# Patient Record
Sex: Female | Born: 1980 | Race: Black or African American | Hispanic: No | Marital: Single | Smoking: Former smoker
Health system: Southern US, Community
[De-identification: ages and names within clinical notes are randomized; demographics above are authoritative.]

## PROBLEM LIST (undated history)

## (undated) DIAGNOSIS — F419 Anxiety disorder, unspecified: Secondary | ICD-10-CM

## (undated) DIAGNOSIS — I10 Essential (primary) hypertension: Secondary | ICD-10-CM

## (undated) HISTORY — PX: ABDOMINAL HYSTERECTOMY: SHX81

---
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
## 2000-01-01 ENCOUNTER — Encounter: Payer: Self-pay | Admitting: Emergency Medicine

## 2001-01-11 ENCOUNTER — Emergency Department (HOSPITAL_COMMUNITY): Admission: EM | Admit: 2001-01-11 | Discharge: 2001-01-11 | Payer: Self-pay | Admitting: Emergency Medicine

## 2001-09-04 ENCOUNTER — Emergency Department (HOSPITAL_COMMUNITY): Admission: EM | Admit: 2001-09-04 | Discharge: 2001-09-04 | Payer: Self-pay | Admitting: Emergency Medicine

## 2001-10-19 ENCOUNTER — Encounter: Payer: Self-pay | Admitting: Emergency Medicine

## 2001-10-19 ENCOUNTER — Emergency Department (HOSPITAL_COMMUNITY): Admission: EM | Admit: 2001-10-19 | Discharge: 2001-10-19 | Payer: Self-pay | Admitting: Emergency Medicine

## 2002-02-11 ENCOUNTER — Emergency Department (HOSPITAL_COMMUNITY): Admission: EM | Admit: 2002-02-11 | Discharge: 2002-02-11 | Payer: Self-pay | Admitting: Emergency Medicine

## 2002-04-19 ENCOUNTER — Inpatient Hospital Stay (HOSPITAL_COMMUNITY): Admission: AD | Admit: 2002-04-19 | Discharge: 2002-04-19 | Payer: Self-pay | Admitting: *Deleted

## 2002-05-01 ENCOUNTER — Inpatient Hospital Stay (HOSPITAL_COMMUNITY): Admission: AD | Admit: 2002-05-01 | Discharge: 2002-05-01 | Payer: Self-pay | Admitting: *Deleted

## 2002-06-24 ENCOUNTER — Emergency Department (HOSPITAL_COMMUNITY): Admission: EM | Admit: 2002-06-24 | Discharge: 2002-06-24 | Payer: Self-pay | Admitting: *Deleted

## 2002-10-15 ENCOUNTER — Emergency Department (HOSPITAL_COMMUNITY): Admission: EM | Admit: 2002-10-15 | Discharge: 2002-10-15 | Payer: Self-pay | Admitting: Emergency Medicine

## 2003-01-28 ENCOUNTER — Inpatient Hospital Stay (HOSPITAL_COMMUNITY): Admission: AD | Admit: 2003-01-28 | Discharge: 2003-01-28 | Payer: Self-pay | Admitting: Obstetrics and Gynecology

## 2003-02-04 ENCOUNTER — Inpatient Hospital Stay (HOSPITAL_COMMUNITY): Admission: AD | Admit: 2003-02-04 | Discharge: 2003-02-04 | Payer: Self-pay | Admitting: *Deleted

## 2004-08-13 ENCOUNTER — Inpatient Hospital Stay (HOSPITAL_COMMUNITY): Admission: AD | Admit: 2004-08-13 | Discharge: 2004-08-13 | Payer: Self-pay | Admitting: *Deleted

## 2005-05-04 ENCOUNTER — Emergency Department (HOSPITAL_COMMUNITY): Admission: EM | Admit: 2005-05-04 | Discharge: 2005-05-04 | Payer: Self-pay | Admitting: Emergency Medicine

## 2005-11-17 ENCOUNTER — Emergency Department (HOSPITAL_COMMUNITY): Admission: EM | Admit: 2005-11-17 | Discharge: 2005-11-17 | Payer: Self-pay | Admitting: Emergency Medicine

## 2006-02-09 ENCOUNTER — Emergency Department (HOSPITAL_COMMUNITY): Admission: EM | Admit: 2006-02-09 | Discharge: 2006-02-09 | Payer: Self-pay | Admitting: Family Medicine

## 2006-11-08 ENCOUNTER — Inpatient Hospital Stay (HOSPITAL_COMMUNITY): Admission: AD | Admit: 2006-11-08 | Discharge: 2006-11-08 | Payer: Self-pay | Admitting: Obstetrics & Gynecology

## 2006-12-09 ENCOUNTER — Emergency Department (HOSPITAL_COMMUNITY): Admission: EM | Admit: 2006-12-09 | Discharge: 2006-12-09 | Payer: Self-pay | Admitting: Emergency Medicine

## 2007-02-08 ENCOUNTER — Emergency Department (HOSPITAL_COMMUNITY): Admission: EM | Admit: 2007-02-08 | Discharge: 2007-02-08 | Payer: Self-pay | Admitting: Emergency Medicine

## 2007-05-15 ENCOUNTER — Emergency Department (HOSPITAL_COMMUNITY): Admission: EM | Admit: 2007-05-15 | Discharge: 2007-05-15 | Payer: Self-pay | Admitting: Emergency Medicine

## 2007-06-19 ENCOUNTER — Emergency Department (HOSPITAL_COMMUNITY): Admission: EM | Admit: 2007-06-19 | Discharge: 2007-06-19 | Payer: Self-pay | Admitting: Emergency Medicine

## 2008-02-28 ENCOUNTER — Emergency Department (HOSPITAL_COMMUNITY): Admission: EM | Admit: 2008-02-28 | Discharge: 2008-02-28 | Payer: Self-pay | Admitting: Emergency Medicine

## 2008-06-20 ENCOUNTER — Inpatient Hospital Stay (HOSPITAL_COMMUNITY): Admission: AD | Admit: 2008-06-20 | Discharge: 2008-06-20 | Payer: Self-pay | Admitting: Gynecology

## 2009-02-19 ENCOUNTER — Inpatient Hospital Stay (HOSPITAL_COMMUNITY): Admission: AD | Admit: 2009-02-19 | Discharge: 2009-02-19 | Payer: Self-pay | Admitting: Family Medicine

## 2009-02-19 ENCOUNTER — Ambulatory Visit: Payer: Self-pay | Admitting: Physician Assistant

## 2009-03-19 ENCOUNTER — Inpatient Hospital Stay (HOSPITAL_COMMUNITY): Admission: AD | Admit: 2009-03-19 | Discharge: 2009-03-19 | Payer: Self-pay | Admitting: Obstetrics and Gynecology

## 2009-03-19 ENCOUNTER — Ambulatory Visit: Payer: Self-pay | Admitting: Physician Assistant

## 2009-04-07 ENCOUNTER — Inpatient Hospital Stay (HOSPITAL_COMMUNITY): Admission: AD | Admit: 2009-04-07 | Discharge: 2009-04-07 | Payer: Self-pay | Admitting: Obstetrics & Gynecology

## 2009-10-28 ENCOUNTER — Inpatient Hospital Stay (HOSPITAL_COMMUNITY): Admission: AD | Admit: 2009-10-28 | Discharge: 2009-10-28 | Payer: Self-pay | Admitting: Obstetrics & Gynecology

## 2009-11-23 ENCOUNTER — Inpatient Hospital Stay (HOSPITAL_COMMUNITY): Admission: AD | Admit: 2009-11-23 | Discharge: 2009-11-23 | Payer: Self-pay | Admitting: Obstetrics & Gynecology

## 2009-12-23 ENCOUNTER — Inpatient Hospital Stay (HOSPITAL_COMMUNITY): Admission: AD | Admit: 2009-12-23 | Discharge: 2009-12-23 | Payer: Self-pay | Admitting: Obstetrics and Gynecology

## 2010-01-15 ENCOUNTER — Ambulatory Visit (HOSPITAL_COMMUNITY): Admission: RE | Admit: 2010-01-15 | Discharge: 2010-01-15 | Payer: Self-pay | Admitting: Family Medicine

## 2010-03-18 ENCOUNTER — Ambulatory Visit: Payer: Self-pay | Admitting: Obstetrics and Gynecology

## 2010-03-18 ENCOUNTER — Inpatient Hospital Stay (HOSPITAL_COMMUNITY): Admission: AD | Admit: 2010-03-18 | Discharge: 2010-03-18 | Payer: Self-pay | Admitting: Obstetrics & Gynecology

## 2010-04-15 ENCOUNTER — Ambulatory Visit: Payer: Self-pay | Admitting: Family Medicine

## 2010-04-15 ENCOUNTER — Inpatient Hospital Stay (HOSPITAL_COMMUNITY): Admission: AD | Admit: 2010-04-15 | Discharge: 2010-04-16 | Payer: Self-pay | Admitting: Family Medicine

## 2010-04-22 ENCOUNTER — Ambulatory Visit (HOSPITAL_COMMUNITY): Admission: RE | Admit: 2010-04-22 | Discharge: 2010-04-22 | Payer: Self-pay | Admitting: Family Medicine

## 2010-04-30 ENCOUNTER — Ambulatory Visit: Payer: Self-pay | Admitting: Family Medicine

## 2010-05-14 ENCOUNTER — Ambulatory Visit: Payer: Self-pay | Admitting: Obstetrics & Gynecology

## 2010-05-18 ENCOUNTER — Inpatient Hospital Stay (HOSPITAL_COMMUNITY): Admission: AD | Admit: 2010-05-18 | Discharge: 2010-05-18 | Payer: Self-pay | Admitting: Family Medicine

## 2010-05-18 ENCOUNTER — Ambulatory Visit: Payer: Self-pay | Admitting: Physician Assistant

## 2010-05-21 ENCOUNTER — Ambulatory Visit: Payer: Self-pay | Admitting: Obstetrics & Gynecology

## 2010-05-24 ENCOUNTER — Inpatient Hospital Stay (HOSPITAL_COMMUNITY): Admission: AD | Admit: 2010-05-24 | Discharge: 2010-05-24 | Payer: Self-pay | Admitting: Obstetrics & Gynecology

## 2010-05-25 ENCOUNTER — Inpatient Hospital Stay (HOSPITAL_COMMUNITY): Admission: AD | Admit: 2010-05-25 | Discharge: 2010-05-26 | Payer: Self-pay | Admitting: Obstetrics & Gynecology

## 2010-05-25 ENCOUNTER — Ambulatory Visit: Payer: Self-pay | Admitting: Physician Assistant

## 2010-07-08 ENCOUNTER — Ambulatory Visit: Payer: Self-pay | Admitting: Obstetrics and Gynecology

## 2010-07-08 ENCOUNTER — Encounter: Payer: Self-pay | Admitting: Obstetrics and Gynecology

## 2010-07-08 LAB — CONVERTED CEMR LAB
HCT: 38 % (ref 36.0–46.0)
MCHC: 33.4 g/dL (ref 30.0–36.0)
RDW: 13.8 % (ref 11.5–15.5)
TSH: 3.116 microintl units/mL (ref 0.350–4.500)

## 2010-12-27 ENCOUNTER — Encounter: Payer: Self-pay | Admitting: *Deleted

## 2010-12-28 ENCOUNTER — Encounter: Payer: Self-pay | Admitting: *Deleted

## 2011-02-19 LAB — POCT URINALYSIS DIPSTICK
Bilirubin Urine: NEGATIVE
Glucose, UA: NEGATIVE mg/dL
Ketones, ur: NEGATIVE mg/dL
Nitrite: NEGATIVE
Specific Gravity, Urine: 1.025 (ref 1.005–1.030)
Urobilinogen, UA: 0.2 mg/dL (ref 0.0–1.0)
pH: 6 (ref 5.0–8.0)

## 2011-02-21 LAB — RPR: RPR Ser Ql: NONREACTIVE

## 2011-02-21 LAB — URINALYSIS, ROUTINE W REFLEX MICROSCOPIC
Nitrite: NEGATIVE
Protein, ur: NEGATIVE mg/dL
Protein, ur: NEGATIVE mg/dL
Specific Gravity, Urine: 1.015 (ref 1.005–1.030)
Specific Gravity, Urine: 1.025 (ref 1.005–1.030)
Urobilinogen, UA: 0.2 mg/dL (ref 0.0–1.0)
Urobilinogen, UA: 0.2 mg/dL (ref 0.0–1.0)

## 2011-02-21 LAB — POCT URINALYSIS DIP (DEVICE)
Bilirubin Urine: NEGATIVE
Glucose, UA: NEGATIVE mg/dL
Protein, ur: 30 mg/dL — AB
Specific Gravity, Urine: 1.02 (ref 1.005–1.030)
Urobilinogen, UA: 0.2 mg/dL (ref 0.0–1.0)
pH: 7 (ref 5.0–8.0)

## 2011-02-21 LAB — URINE MICROSCOPIC-ADD ON

## 2011-02-21 LAB — URINE CULTURE
Colony Count: NO GROWTH
Culture: NO GROWTH

## 2011-02-21 LAB — CBC
HCT: 36.4 % (ref 36.0–46.0)
MCV: 100.2 fL — ABNORMAL HIGH (ref 78.0–100.0)
Platelets: 272 10*3/uL (ref 150–400)

## 2011-02-22 LAB — GC/CHLAMYDIA PROBE AMP, GENITAL: GC Probe Amp, Genital: NEGATIVE

## 2011-02-22 LAB — WET PREP, GENITAL

## 2011-02-22 LAB — POCT URINALYSIS DIP (DEVICE)
Bilirubin Urine: NEGATIVE
Glucose, UA: NEGATIVE mg/dL
Hgb urine dipstick: NEGATIVE
Hgb urine dipstick: NEGATIVE
Ketones, ur: NEGATIVE mg/dL
Nitrite: NEGATIVE
Protein, ur: NEGATIVE mg/dL
Specific Gravity, Urine: 1.025 (ref 1.005–1.030)
Urobilinogen, UA: 0.2 mg/dL (ref 0.0–1.0)
pH: 7 (ref 5.0–8.0)

## 2011-02-22 LAB — STREP B DNA PROBE

## 2011-02-23 LAB — GC/CHLAMYDIA PROBE AMP, GENITAL
Chlamydia, DNA Probe: NEGATIVE
GC Probe Amp, Genital: NEGATIVE

## 2011-02-23 LAB — URINE CULTURE: Colony Count: 75000

## 2011-02-23 LAB — CBC
HCT: 33.9 % — ABNORMAL LOW (ref 36.0–46.0)
Platelets: 249 10*3/uL (ref 150–400)
RBC: 3.4 MIL/uL — ABNORMAL LOW (ref 3.87–5.11)

## 2011-02-23 LAB — URINALYSIS, ROUTINE W REFLEX MICROSCOPIC
Ketones, ur: 15 mg/dL — AB
Urobilinogen, UA: 0.2 mg/dL (ref 0.0–1.0)
pH: 7.5 (ref 5.0–8.0)

## 2011-02-23 LAB — STREP B DNA PROBE: Strep Group B Ag: POSITIVE

## 2011-02-23 LAB — FETAL FIBRONECTIN: Fetal Fibronectin: NEGATIVE

## 2011-02-23 LAB — MAGNESIUM: Magnesium: 4 mg/dL — ABNORMAL HIGH (ref 1.5–2.5)

## 2011-02-24 LAB — URINALYSIS, ROUTINE W REFLEX MICROSCOPIC
Bilirubin Urine: NEGATIVE
Nitrite: NEGATIVE
Specific Gravity, Urine: 1.01 (ref 1.005–1.030)
Urobilinogen, UA: 0.2 mg/dL (ref 0.0–1.0)
pH: 8.5 — ABNORMAL HIGH (ref 5.0–8.0)

## 2011-02-24 LAB — WET PREP, GENITAL
Clue Cells Wet Prep HPF POC: NONE SEEN
Trich, Wet Prep: NONE SEEN

## 2011-03-08 LAB — URINE MICROSCOPIC-ADD ON

## 2011-03-08 LAB — URINALYSIS, ROUTINE W REFLEX MICROSCOPIC
Bilirubin Urine: NEGATIVE
Glucose, UA: NEGATIVE mg/dL
Protein, ur: NEGATIVE mg/dL
Urobilinogen, UA: 0.2 mg/dL (ref 0.0–1.0)

## 2011-03-08 LAB — CBC
Platelets: 241 10*3/uL (ref 150–400)
RDW: 14.4 % (ref 11.5–15.5)
WBC: 11.9 10*3/uL — ABNORMAL HIGH (ref 4.0–10.5)

## 2011-03-08 LAB — URINE CULTURE: Colony Count: 80000

## 2011-03-10 LAB — URINE CULTURE: Colony Count: 10000

## 2011-03-10 LAB — CBC
HCT: 37 % (ref 36.0–46.0)
Hemoglobin: 12.6 g/dL (ref 12.0–15.0)
Platelets: 257 10*3/uL (ref 150–400)
RBC: 3.8 MIL/uL — ABNORMAL LOW (ref 3.87–5.11)
WBC: 11.2 10*3/uL — ABNORMAL HIGH (ref 4.0–10.5)

## 2011-03-10 LAB — POCT PREGNANCY, URINE: Preg Test, Ur: POSITIVE

## 2011-03-10 LAB — GC/CHLAMYDIA PROBE AMP, GENITAL
Chlamydia, DNA Probe: NEGATIVE
GC Probe Amp, Genital: NEGATIVE

## 2011-03-10 LAB — WET PREP, GENITAL
Trich, Wet Prep: NONE SEEN
Yeast Wet Prep HPF POC: NONE SEEN

## 2011-03-10 LAB — URINALYSIS, ROUTINE W REFLEX MICROSCOPIC
Bilirubin Urine: NEGATIVE
Glucose, UA: NEGATIVE mg/dL
Hgb urine dipstick: NEGATIVE
Ketones, ur: 15 mg/dL — AB
Protein, ur: NEGATIVE mg/dL
Urobilinogen, UA: 0.2 mg/dL (ref 0.0–1.0)

## 2011-03-16 LAB — CBC
HCT: 32.4 % — ABNORMAL LOW (ref 36.0–46.0)
Hemoglobin: 11.1 g/dL — ABNORMAL LOW (ref 12.0–15.0)
MCHC: 34.2 g/dL (ref 30.0–36.0)
MCV: 94.7 fL (ref 78.0–100.0)
Platelets: 246 K/uL (ref 150–400)
RBC: 3.43 MIL/uL — ABNORMAL LOW (ref 3.87–5.11)
RDW: 18 % — ABNORMAL HIGH (ref 11.5–15.5)
WBC: 5.6 K/uL (ref 4.0–10.5)

## 2011-03-16 LAB — WET PREP, GENITAL
Trich, Wet Prep: NONE SEEN
Yeast Wet Prep HPF POC: NONE SEEN

## 2011-03-16 LAB — SAMPLE TO BLOOD BANK

## 2011-03-17 LAB — URINE MICROSCOPIC-ADD ON

## 2011-03-17 LAB — WET PREP, GENITAL
Trich, Wet Prep: NONE SEEN
Yeast Wet Prep HPF POC: NONE SEEN

## 2011-03-17 LAB — URINE CULTURE

## 2011-03-17 LAB — URINALYSIS, ROUTINE W REFLEX MICROSCOPIC
Bilirubin Urine: NEGATIVE
Glucose, UA: NEGATIVE mg/dL
Ketones, ur: NEGATIVE mg/dL
Leukocytes, UA: NEGATIVE
Nitrite: NEGATIVE
Protein, ur: NEGATIVE mg/dL

## 2011-03-18 LAB — POCT PREGNANCY, URINE: Preg Test, Ur: POSITIVE

## 2011-03-18 LAB — URINALYSIS, ROUTINE W REFLEX MICROSCOPIC
Bilirubin Urine: NEGATIVE
Nitrite: NEGATIVE
Specific Gravity, Urine: 1.02 (ref 1.005–1.030)
Urobilinogen, UA: 0.2 mg/dL (ref 0.0–1.0)
pH: 7 (ref 5.0–8.0)

## 2011-03-18 LAB — WET PREP, GENITAL: Yeast Wet Prep HPF POC: NONE SEEN

## 2011-03-18 LAB — GC/CHLAMYDIA PROBE AMP, GENITAL
Chlamydia, DNA Probe: NEGATIVE
GC Probe Amp, Genital: NEGATIVE

## 2011-04-06 ENCOUNTER — Inpatient Hospital Stay (HOSPITAL_COMMUNITY)
Admission: AD | Admit: 2011-04-06 | Discharge: 2011-04-06 | Disposition: A | Discharge status: Home / Self Care | Payer: Self-pay | Source: Ambulatory Visit | Attending: Family Medicine | Admitting: Family Medicine

## 2011-04-06 DIAGNOSIS — N949 Unspecified condition associated with female genital organs and menstrual cycle: Secondary | ICD-10-CM

## 2011-04-06 DIAGNOSIS — N938 Other specified abnormal uterine and vaginal bleeding: Secondary | ICD-10-CM | POA: Insufficient documentation

## 2011-04-06 LAB — URINALYSIS, ROUTINE W REFLEX MICROSCOPIC
Glucose, UA: NEGATIVE mg/dL
Ketones, ur: NEGATIVE mg/dL
Leukocytes, UA: NEGATIVE
Nitrite: NEGATIVE
Specific Gravity, Urine: <1.005 — ABNORMAL LOW (ref 1.005–1.030)
pH: 6 (ref 5.0–8.0)

## 2011-04-06 LAB — CBC
Hemoglobin: 11 g/dL — ABNORMAL LOW (ref 12.0–15.0)
MCH: 30.1 pg (ref 26.0–34.0)
MCHC: 32.4 g/dL (ref 30.0–36.0)
RDW: 14.5 % (ref 11.5–15.5)

## 2011-04-06 LAB — URINE MICROSCOPIC-ADD ON

## 2011-04-06 LAB — SAMPLE TO BLOOD BANK

## 2011-04-06 LAB — POCT PREGNANCY, URINE: Preg Test, Ur: NEGATIVE

## 2011-04-20 NOTE — Assessment & Plan Note (Signed)
NAMEMARILI, [name]            ACCOUNT NO.:  [id number]   MEDICAL RECORD NO.:  [id number]          PATIENT TYPE:  WOC   LOCATION:  [facility] at [facility]         FACILITY:  [facility]   PHYSICIAN:  Caren Griffins, CNM       DATE OF BIRTH:  [birth year]   DATE OF SERVICE:  07/08/2010                                  CLINIC NOTE   REASON FOR VISIT:  A 6-week postpartum exam.   HISTORY:  Jaimie is a 30 year old G2, P 0-1-1-1 who had a NSVD on May 25, 2010, of female weighing 6 pounds 6 ounces at 36-4/7 weeks.  She did  have a first-degree perineal laceration that was repaired but no  complications.  She states that the delivery of the placenta was  uncomplicated.  Her chief concern today is cramping.  She rates her pain  8/10 and is menstrual like cramping in the suprapubic area and she is  also reporting bleeding, which is not diminished much since she left the  hospital and she states that she saturates 3 pads a day and that the  color is red.  She is not passing tissue or clots.  She does feel  fatigued but no orthostatic symptoms.  She is not sleeping well due to  the baby's wakefulness.  Of note, she is known to have a 4-cm anterior  fibroid and in the past had fairly regular periods that were heavy at  times.  She received Depo-Provera 150 mg IM before discharge on hospital  day #2.  She is bottle feeding, the baby is thriving.  She is not having  any perineal pain and is not sexually active yet.  She is not concerned  with any dependent edema and denies depression.  She is taking Tylenol  up to 10 Regular Strength Tylenols per day due to her cramping.  She is  allergic IBUPROFEN.  Her last Pap was normal January of this year.  She  does state that she went to the dentist who told her that she may have a  mass on her thyroid; however, she does not have symptoms of  hypothyroidism or hyperthyroidism.   OBJECTIVE:  VITAL SIGNS:  Temperature 97.6, pulse 61, BP 123/84, weight  is 271,  height is 5 feet 9 inches.  Her early pregnancy weight was  around 268.  GENERAL:  WN/WD and pleasant AA female in NAD with appropriate affect.  NECK:  Thyroid, unable to feel any specific mass or thyromegaly.  BREASTS:  No problems noted and breast exam was deferred.  ABDOMEN:  Obese, soft, nontender.  No discrete masses.  HEART:  RRR without murmur.  LUNGS:  CTA bilateral.  EXTREMITIES:  Legs without edema and intact.  Pulses full and equal  bilaterally.  PELVIC:  NEFG.  Perineum well healed.  Speculum reveals this very scant  amount of blood-tinged mucus.  No active bleeding from the cervical os.  No pooling of blood in the vagina.  Cervix appears closed and clean.  Bimanual, cervix close, long, smooth.  Uterus is upper limits normal  size, nontender, and mobile.  No adnexal tenderness or masses.   ASSESSMENT:  Doing reasonably well at 6 weeks  postpartum, morbid  obesity, 4-cm fibroid, history of abnormal bleeding post Depo-Provera  with essentially normal exam.   PLAN:  In consultation with Dr. Okey Dupre, the decision is made to go ahead  and give her another Depo-Provera shot today to control the bleeding and  particularly since her weight is 271 and the Depo may be less effective  for that reason.  We will also go ahead and get a CBC, UA, and a TSH  today.  If any of these are abnormal, we will call her  to come back for followup.  Otherwise, she can keep a bleeding history  and come back to 3 months when she will be due for a Depo-Provera again.           ______________________________  Caren Griffins, CNM     DP/MEDQ  D:  07/08/2010  T:  07/09/2010  Job:  [id number]

## 2011-04-22 ENCOUNTER — Encounter: Payer: Self-pay | Admitting: Obstetrics and Gynecology

## 2011-05-12 ENCOUNTER — Encounter: Payer: Self-pay | Admitting: Obstetrics and Gynecology

## 2011-09-03 LAB — URINALYSIS, ROUTINE W REFLEX MICROSCOPIC
Ketones, ur: NEGATIVE
Leukocytes, UA: NEGATIVE
Nitrite: NEGATIVE
Protein, ur: NEGATIVE
pH: 7

## 2011-09-03 LAB — WET PREP, GENITAL
Clue Cells Wet Prep HPF POC: NONE SEEN
WBC, Wet Prep HPF POC: NONE SEEN

## 2011-09-03 LAB — URINE MICROSCOPIC-ADD ON

## 2011-09-03 LAB — GC/CHLAMYDIA PROBE AMP, GENITAL: Chlamydia, DNA Probe: NEGATIVE

## 2011-09-21 LAB — POCT PREGNANCY, URINE
Operator id: 29001
Preg Test, Ur: NEGATIVE

## 2011-09-21 LAB — URINALYSIS, ROUTINE W REFLEX MICROSCOPIC
Glucose, UA: NEGATIVE
Ketones, ur: NEGATIVE
Protein, ur: 100 — AB

## 2011-09-21 LAB — DIFFERENTIAL
Basophils Absolute: 0.1
Lymphocytes Relative: 26
Lymphs Abs: 2.6
Monocytes Absolute: 0.5
Monocytes Relative: 5
Neutro Abs: 6.5

## 2011-09-21 LAB — CBC
Hemoglobin: 12.6
RBC: 4.03
RDW: 15.8 — ABNORMAL HIGH
WBC: 9.8

## 2011-09-23 LAB — URINE MICROSCOPIC-ADD ON

## 2011-09-23 LAB — DIFFERENTIAL
Basophils Absolute: 0
Basophils Relative: 0
Monocytes Absolute: 0.4
Neutro Abs: 7.8 — ABNORMAL HIGH
Neutrophils Relative %: 79 — ABNORMAL HIGH

## 2011-09-23 LAB — URINALYSIS, ROUTINE W REFLEX MICROSCOPIC
Nitrite: NEGATIVE
Specific Gravity, Urine: 1.02
pH: 6

## 2011-09-23 LAB — CBC
MCHC: 32.8
Platelets: 267
RDW: 17.1 — ABNORMAL HIGH

## 2013-03-23 ENCOUNTER — Encounter (HOSPITAL_COMMUNITY): Payer: Self-pay | Admitting: *Deleted

## 2013-03-23 ENCOUNTER — Emergency Department (HOSPITAL_COMMUNITY)
Admission: EM | Admit: 2013-03-23 | Discharge: 2013-03-23 | Disposition: A | Discharge status: Home / Self Care | Payer: Medicaid Other | Attending: Emergency Medicine | Admitting: Emergency Medicine

## 2013-03-23 DIAGNOSIS — N611 Abscess of the breast and nipple: Secondary | ICD-10-CM

## 2013-03-23 DIAGNOSIS — N61 Mastitis without abscess: Secondary | ICD-10-CM | POA: Insufficient documentation

## 2013-03-23 DIAGNOSIS — Z87891 Personal history of nicotine dependence: Secondary | ICD-10-CM | POA: Insufficient documentation

## 2013-03-23 MED ORDER — HYDROCODONE-ACETAMINOPHEN 5-325 MG PO TABS: 1.0000 | ORAL_TABLET | Freq: Three times a day (TID) | ORAL | Status: DC | PRN

## 2013-03-23 MED ORDER — DOXYCYCLINE HYCLATE 100 MG PO CAPS: 100.0000 mg | ORAL_CAPSULE | Freq: Two times a day (BID) | ORAL | Status: DC

## 2013-03-23 MED ORDER — ACETAMINOPHEN 500 MG PO TABS: 500.0000 mg | ORAL_TABLET | Freq: Four times a day (QID) | ORAL | Status: DC | PRN

## 2013-03-23 MED ORDER — HYDROCODONE-ACETAMINOPHEN 5-325 MG PO TABS: 2.0000 | ORAL_TABLET | ORAL | Status: DC | PRN

## 2013-03-23 MED ORDER — ACETAMINOPHEN 500 MG PO TABS
1000.0000 mg | ORAL_TABLET | Freq: Once | ORAL | Status: AC
  Administered 2013-03-23: 1000 mg via ORAL
  Filled 2013-03-23: qty 2

## 2013-03-23 NOTE — ED Provider Notes (Signed)
History    This chart was scribed for non-physician practitioner, Junius Finner, PA-C, working with Suzi Roots, MD by Melba Coon, ED Scribe. This patient was seen in room WTR6/WTR6 and the patient's care was started at 5:49PM.    CSN: [id number]  Arrival date & time 03/23/13  1711   None     Chief Complaint  Patient presents with  . Breast Pain    (Consider location/radiation/quality/duration/timing/severity/associated sxs/prior treatment) The history is provided by the patient. No language interpreter was used.   [name] is a 32 y.o. female who presents to the Emergency Department complaining of constant, moderate left breast pain with an onset 2 weeks ago. She reports a small bump to the left areola with mild pruritis around the area without onset. Since onset, the bump has grown in size and in pain, and now pain radiates from the area of the bump throughout the entire breast and under the left axilla. Rubbing alcohol did not stunt the growth of the bump or alleviate the pain; she reported the rubbing alcohol only irritated and burned the area of skin. OTC ibuprofen and acetaminophen at home did not alleviate the pain. She has never experienced these type of symptoms before. Denies HA, fever, neck pain, sore throat, rash, back pain, SOB, abdominal pain, nausea, emesis, diarrhea, dysuria, or extremity pain, edema, weakness, numbness, or tingling. Mildly allergic to ibuprofen but she can tolerate it. No other pertinent medical symptoms. Not currently pregnant or breast feeding.  Does not plan on becoming pregnant.   History reviewed. No pertinent past medical history.  History reviewed. No pertinent past surgical history.  History reviewed. No pertinent family history.  History  Substance Use Topics  . Smoking status: Former Games developer  . Smokeless tobacco: Never Used  . Alcohol Use: 0.0 oz/week    2-3 Cans of beer per week     Comment: social drinker, "on the  weekends"    OB History   Grav Para Term Preterm Abortions TAB SAB Ect Mult Living                  Review of Systems 10 Systems reviewed and all are negative for acute change except as noted in the HPI.   Allergies  Ibuprofen  Home Medications   Current Outpatient Rx  Name  Route  Sig  Dispense  Refill  . acetaminophen (TYLENOL) 500 MG tablet   Oral   Take 500 mg by mouth every 6 (six) hours as needed for pain.         Marland Kitchen acetaminophen (TYLENOL) 500 MG tablet   Oral   Take 1 tablet (500 mg total) by mouth every 6 (six) hours as needed for pain.   30 tablet   0   . doxycycline (VIBRAMYCIN) 100 MG capsule   Oral   Take 1 capsule (100 mg total) by mouth 2 (two) times daily.   20 capsule   0   . HYDROcodone-acetaminophen (NORCO/VICODIN) 5-325 MG per tablet   Oral   Take 1 tablet by mouth every 8 (eight) hours as needed for pain.   10 tablet   0     BP 137/101  Pulse 86  Temp(Src) 98.3 F (36.8 C) (Oral)  Resp 18  Ht 5\' 9"  (1.753 m)  Wt 320 lb (145.151 kg)  BMI 47.23 kg/m2  SpO2 100%  LMP 02/27/2013  Physical Exam  Nursing note and vitals reviewed. Constitutional: She is oriented to person, place, and time.  She appears well-developed and well-nourished.  Awake, alert, nontoxic appearance.  HENT:  Head: Normocephalic and atraumatic.  Right Ear: External ear normal.  Left Ear: External ear normal.  Nose: Nose normal.  Mouth/Throat: Oropharynx is clear and moist.  Eyes: EOM are normal. Pupils are equal, round, and reactive to light. Right eye exhibits no discharge. Left eye exhibits no discharge.  Neck: Normal range of motion. Neck supple. No tracheal deviation present.  No nuchal rigidity no meningeal signs  Cardiovascular: Normal rate and regular rhythm.   Pulmonary/Chest: Effort normal and breath sounds normal. No stridor. No respiratory distress. She has no wheezes. She has no rales. She exhibits no tenderness.  Abdominal: Soft. She exhibits no  distension and no mass. There is no tenderness. There is no rebound and no guarding.  Musculoskeletal: Normal range of motion. She exhibits tenderness. She exhibits no edema.  Tenderness under the left axilla. Baseline ROM, no obvious new focal weakness.  Neurological: She is alert and oriented to person, place, and time. She has normal reflexes. No cranial nerve deficit. She exhibits normal muscle tone. Coordination normal.  Mental status and motor strength appears baseline for patient and situation.  Skin: Skin is warm. Rash noted. Rash is pustular ( one 5x4cm abscess over left areiola, medial inferior margin.  Errythremic, non-draining.  TTP). She is not diaphoretic. There is erythema. No pallor.  Psychiatric: She has a normal mood and affect.    ED Course  Procedures (including critical care time)  DIAGNOSTIC STUDIES: Oxygen Saturation is 100% on room air, normal by my interpretation.    COORDINATION OF CARE:  5:54PM - will discuss case with attending physician 6:00PM - Dr Denton Lank, attending physician, visits pt and advises I&D  6:07PM INCISION AND DRAINAGE  Performed by: Junius Finner, PA-C Authorized by: Suzi Roots, MD  Consent - Verbal Consent obtained Risks and benefits: risks/benefits and alternatives were discussed  Type: Abscess  Body Area: areola of the left breast  Anesthesia: Local infiltration Local anesthetic: lidocaine 1% with epinephrine  Anesthetic total: 4 ml  Complexity: Simple  Blunt dissection to break up loculations  Drainage: Purulent, bloody  Drainage amount: minimal  Packing material: none  Patient tolerance: Patient tolerated the procedure well with no immediate complications   6:16PM - norco, acetaminophen, and doxycycline will be prescribed for [name]. She is ready for d/c.   Labs Reviewed - No data to display No results found.   1. Abscess of breast, left       MDM  Pt c/o painful abscess on left areola that  started as small bump 2wks ago.  Pt states she has had a few simple boils as a child but does not recall an similar abscess to today's.  Denies fever, n/v/d, drainage from abscess or nipple.  Denies pregnancy or breast feeding. Reports pain has also radiated into left axilla.    Consulted Dr. Denton Lank.  He advised to perform simple I&D, rx doxy and have pt f/u with Dr. Magnus Ivan to ensure abscess is healing properly.   Tx in ED- I&D, see procedure note.  Acetaminophen prior to leaving.  Rx: norco, acetaminophen and doxycycline.  Advised pt to use warm compresses several times a day with gentle massage to allow abscess to continue to drain.  F/u with Dr. Magnus Ivan in 4-5 days to ensure abscess is healing properly, sooner if abscess appears to be worsening.   I personally performed the services described in this documentation, which was scribed in my  presence. The recorded information has been reviewed and is accurate.       Junius Finner, PA-C 03/23/13 [phone]

## 2013-03-23 NOTE — ED Notes (Addendum)
Pt noted a small bump on the aerola of her left breast and she states was mildly itchy at that time but denies pain. Pt states that bump enlarged and became painful. Pain now is shooting throughout the left breast. Pt denies any discharge from nipple. There is also pain in her underarm on the left. Pt tried to treat the knot at home with rubbing alcohol. Knot is the size of a quarter per pt.

## 2013-03-24 NOTE — ED Provider Notes (Signed)
Medical screening examination/treatment/procedure(s) were conducted as a shared visit with non-physician practitioner(s) and myself.  I personally evaluated the patient during the encounter Pt with area selling, pain, tenderness to left breast at margin left areola. Appears c/w small abscess. Mild axillary l/a. Discussed tx plan and need close pcp/gen surg follow up.   Suzi Roots, MD 03/24/13 [phone]

## 2014-03-08 ENCOUNTER — Emergency Department (HOSPITAL_COMMUNITY)
Admission: EM | Admit: 2014-03-08 | Discharge: 2014-03-08 | Disposition: A | Discharge status: Other Acute Inpatient Hospital | Payer: Medicaid Other | Source: Home / Self Care | Attending: Emergency Medicine | Admitting: Emergency Medicine

## 2014-03-08 ENCOUNTER — Encounter (HOSPITAL_COMMUNITY): Payer: Self-pay | Admitting: Emergency Medicine

## 2014-03-08 ENCOUNTER — Emergency Department (HOSPITAL_COMMUNITY): Payer: Medicaid Other

## 2014-03-08 ENCOUNTER — Emergency Department (HOSPITAL_COMMUNITY)
Admission: EM | Admit: 2014-03-08 | Discharge: 2014-03-09 | Disposition: A | Discharge status: Home / Self Care | Payer: Medicaid Other | Attending: Emergency Medicine | Admitting: Emergency Medicine

## 2014-03-08 DIAGNOSIS — G8918 Other acute postprocedural pain: Secondary | ICD-10-CM | POA: Insufficient documentation

## 2014-03-08 DIAGNOSIS — R599 Enlarged lymph nodes, unspecified: Secondary | ICD-10-CM | POA: Insufficient documentation

## 2014-03-08 DIAGNOSIS — H9209 Otalgia, unspecified ear: Secondary | ICD-10-CM | POA: Insufficient documentation

## 2014-03-08 DIAGNOSIS — K08409 Partial loss of teeth, unspecified cause, unspecified class: Secondary | ICD-10-CM

## 2014-03-08 DIAGNOSIS — K122 Cellulitis and abscess of mouth: Secondary | ICD-10-CM

## 2014-03-08 DIAGNOSIS — M542 Cervicalgia: Secondary | ICD-10-CM | POA: Insufficient documentation

## 2014-03-08 DIAGNOSIS — Z87891 Personal history of nicotine dependence: Secondary | ICD-10-CM | POA: Insufficient documentation

## 2014-03-08 LAB — I-STAT CHEM 8, ED
BUN: 7 mg/dL (ref 6–23)
CALCIUM ION: 1.23 mmol/L (ref 1.12–1.23)
CHLORIDE: 103 meq/L (ref 96–112)
Creatinine, Ser: 0.9 mg/dL (ref 0.50–1.10)
Glucose, Bld: 91 mg/dL (ref 70–99)
HEMATOCRIT: 38 % (ref 36.0–46.0)
Hemoglobin: 12.9 g/dL (ref 12.0–15.0)
Potassium: 4.2 mEq/L (ref 3.7–5.3)
SODIUM: 141 meq/L (ref 137–147)
TCO2: 26 mmol/L (ref 0–100)

## 2014-03-08 MED ORDER — MORPHINE SULFATE 4 MG/ML IJ SOLN
4.0000 mg | Freq: Once | INTRAMUSCULAR | Status: AC
  Administered 2014-03-08: 4 mg via INTRAVENOUS
  Filled 2014-03-08: qty 1

## 2014-03-08 MED ORDER — IOHEXOL 300 MG/ML  SOLN
75.0000 mL | Freq: Once | INTRAMUSCULAR | Status: AC | PRN
  Administered 2014-03-08: 75 mL via INTRAVENOUS

## 2014-03-08 NOTE — ED Notes (Signed)
The pt is c/o  Tooth mouth and neck pain.  She had a tooth extracted on Monday and the pain has been there since she had the tooth extracted

## 2014-03-08 NOTE — Discharge Instructions (Signed)
Pt transferred to ED for further eval.

## 2014-03-08 NOTE — ED Provider Notes (Signed)
Medical screening examination/treatment/procedure(s) were performed by a resident physician and as supervising physician I was immediately available for consultation/collaboration.  Leslee Homeavid Zackeriah Kissler, M.D.  Reuben Likesavid C Keveon Amsler, MD 03/08/14 2142

## 2014-03-08 NOTE — ED Provider Notes (Signed)
CSN: [id number]     Arrival date & time 03/08/14  2045 History  This chart was scribed for Emily Helper, PA-C, working with Junius Argyle, MD, by [facility] ED Scribe. This patient was seen in room TR04C/TR04C and the patient's care was started at 9:22 PM.   Chief Complaint  Patient presents with  . Dental Pain    The history is provided by the patient. No language interpreter was used.    HPI Comments: [name] is a 33 y.o. female who presents to the Emergency Department complaining of constant, moderate dental pain over the past couple days. She states that she had a tooth (#4) pulled 4 days ago, and that the area has since become infected. She reports associated gumline swelling, neck pain and ear pain. She states that she went to an Urgent Care facility today, wanting antibiotics, and she was sent here. She states that chewing, eating and cold air worsen her pain. She states that has been taking prescribed Norco with minimal relief of her pain. She denies fever other symptoms. She states that there is no chance she could be pregnant.    History reviewed. No pertinent past medical history. History reviewed. No pertinent past surgical history. No family history on file. History  Substance Use Topics  . Smoking status: Former Games developer  . Smokeless tobacco: Never Used  . Alcohol Use: 0.0 oz/week    2-3 Cans of beer per week     Comment: social drinker, "on the weekends"   OB History   Grav Para Term Preterm Abortions TAB SAB Ect Mult Living                 Review of Systems  Constitutional: Negative for fever.  HENT: Positive for dental problem and ear pain.   Musculoskeletal: Positive for neck pain.  All other systems reviewed and are negative.   Allergies  Ibuprofen  Home Medications   Current Outpatient Rx  Name  Route  Sig  Dispense  Refill  . acetaminophen (TYLENOL) 500 MG tablet   Oral   Take 500 mg by mouth every 6 (six) hours as needed for pain.           Triage Vitals: BP 145/131  Pulse 80  Temp(Src) 98.3 F (36.8 C) (Oral)  Resp 20  Ht 5\' 9"  (1.753 m)  Wt 317 lb (143.79 kg)  BMI 46.79 kg/m2  SpO2 100%  Physical Exam  Nursing note and vitals reviewed. Constitutional: She is oriented to person, place, and time. She appears well-developed and well-nourished. No distress.  HENT:  Head: Normocephalic and atraumatic.  Gumline at tooth 4 which has already been extracted is edematous with exudate. Moderately tender to palpation. No trismus.  Eyes: EOM are normal.  Neck: Neck supple. No tracheal deviation present.  Right anterior cervical lymphadenopathy noted.  Cardiovascular: Normal rate.   Pulmonary/Chest: Effort normal. No respiratory distress.  Musculoskeletal: Normal range of motion.  Lymphadenopathy:    She has cervical adenopathy.  Neurological: She is alert and oriented to person, place, and time.  Skin: Skin is warm and dry.  Psychiatric: She has a normal mood and affect. Her behavior is normal.    ED Course  Procedures (including critical care time)  DIAGNOSTIC STUDIES: Oxygen Saturation is 100% on RA, normal by my interpretation.    COORDINATION OF CARE: 9:27 PM- Will order morphine for pain. Will also obtain a CT w/ contrast of pt's neck. Pt advised of plan for  treatment and pt agrees.  12:11 AM CT shows no evidence of deep tissue infection.  Will provide abx, pain med and pt to f/u with dentist for further care.    Medications  morphine 4 MG/ML injection 4 mg (not administered)   Labs Review Labs Reviewed  I-STAT CHEM 8, ED   Imaging Review No results found.   EKG Interpretation None      MDM   Final diagnoses:  Status post tooth extraction    BP 143/100  Pulse 72  Temp(Src) 98.1 F (36.7 C) (Oral)  Resp 18  Ht 5\' 9"  (1.753 m)  Wt 317 lb (143.79 kg)  BMI 46.79 kg/m2  SpO2 100%  LMP 02/19/2014  I have reviewed nursing notes and vital signs. I personally reviewed the imaging  tests through PACS system  I reviewed available ER/hospitalization records thought the EMR  I personally performed the services described in this documentation, which was scribed in my presence. The recorded information has been reviewed and is accurate.    Emily HelperBowie Cornelius Schuitema, PA-C 03/09/14 [phone]

## 2014-03-08 NOTE — ED Provider Notes (Signed)
CSN: [id number]     Arrival date & time 03/08/14  1907 History   First MD Initiated Contact with Patient 03/08/14 1944     Chief Complaint  Patient presents with  . Dental Pain   (Consider location/radiation/quality/duration/timing/severity/associated sxs/prior Treatment) HPI  Mouth pain and drainage. Pt with superior tooth (premolar) pulled 5 days ago. Pt since that time it has gotten infected it is hurting worse. She notes pain in her jaw as well as the right side of her neck. She has not taken any antibiotics. She is taking hydrocodone without relief of pain. She denies any fever or chills.  History reviewed. No pertinent past medical history. History reviewed. No pertinent past surgical history. No family history on file. History  Substance Use Topics  . Smoking status: Former Games developermoker  . Smokeless tobacco: Never Used  . Alcohol Use: 0.0 oz/week    2-3 Cans of beer per week     Comment: social drinker, "on the weekends"   OB History   Grav Para Term Preterm Abortions TAB SAB Ect Mult Living                 Review of Systems  Allergies  Ibuprofen  Home Medications   Current Outpatient Rx  Name  Route  Sig  Dispense  Refill  . acetaminophen (TYLENOL) 500 MG tablet   Oral   Take 500 mg by mouth every 6 (six) hours as needed for pain.         Marland Kitchen. acetaminophen (TYLENOL) 500 MG tablet   Oral   Take 1 tablet (500 mg total) by mouth every 6 (six) hours as needed for pain.   30 tablet   0   . doxycycline (VIBRAMYCIN) 100 MG capsule   Oral   Take 1 capsule (100 mg total) by mouth 2 (two) times daily.   20 capsule   0   . HYDROcodone-acetaminophen (NORCO/VICODIN) 5-325 MG per tablet   Oral   Take 1 tablet by mouth every 8 (eight) hours as needed for pain.   10 tablet   0    BP 147/117  Pulse 75  Temp(Src) 98.4 F (36.9 C) (Oral)  Resp 20  SpO2 99% Physical Exam  Gen: obese female, uncomfortable appearing, but non toxic  Mouth: necrotic tissue of superior  gingiva in socket where tooth # 5 used to be, swelling with necrotic appearing tissue on roof of mouth as well Neck: fullness of right lateral neck not consistent with lymphadenopathy Ear: no otalgia, no middle ear effusion   ED Course  Procedures (including critical care time) Labs Review Labs Reviewed  WOUND CULTURE   Imaging Review No results found.   MDM   1. Abscess of oral tissue    Given the severity of the abscess in her mouth roof with pain spreading to her right lateral neck, I'm concerned for a large abscess that can be seen on physical exam. Therefore I think it would be best if she had a CT scan of her face and neck. This cannot be done in the urgent care. So the patient will be transferred to the emergency department for further evaluation.     Garnetta BuddyEdward V Linnaea Ahn, MD 03/08/14 2036

## 2014-03-08 NOTE — ED Notes (Signed)
Pt st's she had a right upper tooth pulled on Mon.  St's she is having pain in this area and Norco is not helping.

## 2014-03-08 NOTE — ED Notes (Addendum)
Patient c/o uncontrolled pain following a dental extraction 3 days ago. C/o her dentist has not returned her calls, and message on answering machine today instructs patients to go to an urgent care if there are any problems patient concerned she may have an infection

## 2014-03-08 NOTE — ED Notes (Signed)
Pt brought back from CT due to Lab results not final yet.   Pt voices understanding.

## 2014-03-08 NOTE — ED Notes (Signed)
Pt to CT at this time.

## 2014-03-09 MED ORDER — PENICILLIN V POTASSIUM 500 MG PO TABS
500.0000 mg | ORAL_TABLET | Freq: Three times a day (TID) | ORAL | Status: DC
Start: 2014-03-09 — End: 2015-03-02

## 2014-03-09 MED ORDER — HYDROCODONE-ACETAMINOPHEN 5-325 MG PO TABS: 1.0000 | ORAL_TABLET | Freq: Four times a day (QID) | ORAL | Status: DC | PRN

## 2014-03-09 NOTE — ED Provider Notes (Signed)
Medical screening examination/treatment/procedure(s) were performed by non-physician practitioner and as supervising physician I was immediately available for consultation/collaboration.   EKG Interpretation None        Junius ArgyleForrest S Morgon Pamer, MD 03/09/14 1232

## 2014-03-09 NOTE — Discharge Instructions (Signed)
You have been evaluated.  No evidence of deep tissue infection on today's CT scan.  Take antibiotic, pain medication and follow up with your dentist for further care.     Teeth and Gum Care Most problems with teeth and gums can be prevented if you do the following:  Brush your teeth.  Brush after each meal or at least 3 times a day with a soft bristle tooth brush.  Brush in a circular motion.  Brush close to your gums as well as your teeth.  Brush your back teeth longer than your front teeth.  Floss your teeth once a day. Before bedtime is a good time to floss.  Go to a dentist 2 times a year.  Eat a balanced diet.  Eat good meals including fruits, vegetables, fish, chicken or other meat, and milk products.  Drink 8 to 10 glasses of water a day.  Do not eat candy or snack foods.  Do not  drink sugar-sweetened soft drinks or juice.  If a problem develops, go to your dentist right away.  Common problems are cavities, bleeding gums, sores or lumps in your mouth, or pain in your teeth, gums or jaws.  It is important to see a Dentist right away for these problems because you may lose your teeth or have a lot of pain if you do not go soon.  Take care of your child's teeth.  Brush the teeth with a soft bristle tooth brush.  Do not let your child eat or drink sweet foods and drinks.  Do not let your child fall asleep with a bottle in his or her mouth.  Take your child to a dentist when they are between 862 and 33 years old. This is the age you should start taking your child to a dentist. If your child has problems with their teeth, take them to a dentist sooner.  If a tooth is knocked out (yours or your child's):  Save the tooth if possible.  Wrap it in tissue or put it in a glass of milk.  Take it with you to your dentist right away. Document Released: 08/31/2008 Document Revised: 02/14/2012 Document Reviewed: 08/31/2008 [facility] Patient Information 2014 [place],  [place].

## 2014-03-11 LAB — WOUND CULTURE
CULTURE: NORMAL
GRAM STAIN: NONE SEEN
SPECIAL REQUESTS: NORMAL

## 2014-08-29 ENCOUNTER — Emergency Department (HOSPITAL_COMMUNITY)
Admission: EM | Admit: 2014-08-29 | Discharge: 2014-08-30 | Disposition: A | Discharge status: Home / Self Care | Payer: Medicaid Other | Attending: Emergency Medicine | Admitting: Emergency Medicine

## 2014-08-29 ENCOUNTER — Emergency Department (HOSPITAL_COMMUNITY): Payer: Medicaid Other

## 2014-08-29 ENCOUNTER — Encounter (HOSPITAL_COMMUNITY): Payer: Self-pay | Admitting: Emergency Medicine

## 2014-08-29 DIAGNOSIS — M5431 Sciatica, right side: Secondary | ICD-10-CM

## 2014-08-29 DIAGNOSIS — M25559 Pain in unspecified hip: Secondary | ICD-10-CM | POA: Insufficient documentation

## 2014-08-29 DIAGNOSIS — M5430 Sciatica, unspecified side: Secondary | ICD-10-CM | POA: Insufficient documentation

## 2014-08-29 DIAGNOSIS — Z87891 Personal history of nicotine dependence: Secondary | ICD-10-CM | POA: Insufficient documentation

## 2014-08-29 DIAGNOSIS — Z792 Long term (current) use of antibiotics: Secondary | ICD-10-CM | POA: Insufficient documentation

## 2014-08-29 MED ORDER — PREDNISONE 20 MG PO TABS
60.0000 mg | ORAL_TABLET | Freq: Once | ORAL | Status: AC
  Administered 2014-08-29: 60 mg via ORAL
  Filled 2014-08-29: qty 3

## 2014-08-29 MED ORDER — OXYCODONE-ACETAMINOPHEN 5-325 MG PO TABS
1.0000 | ORAL_TABLET | Freq: Once | ORAL | Status: AC
  Administered 2014-08-29: 1 via ORAL
  Filled 2014-08-29: qty 1

## 2014-08-29 MED ORDER — CYCLOBENZAPRINE HCL 10 MG PO TABS: 10.0000 mg | ORAL_TABLET | Freq: Two times a day (BID) | ORAL | Status: DC | PRN

## 2014-08-29 MED ORDER — HYDROCODONE-ACETAMINOPHEN 5-325 MG PO TABS: 1.0000 | ORAL_TABLET | ORAL | Status: DC | PRN

## 2014-08-29 MED ORDER — PREDNISONE 10 MG PO TABS
ORAL_TABLET | ORAL | Status: DC
Start: 2014-08-29 — End: 2015-03-02

## 2014-08-29 NOTE — ED Provider Notes (Signed)
CSN: [id number]     Arrival date & time 08/29/14  2251 History  This chart was scribed for non-physician practitioner working with Audree Camel, MD, by Roxy Cedar ED Scribe. This patient was seen in room WTR5/WTR5 and the patient's care was started at 11:06 PM   Chief Complaint  Patient presents with  . Hip Pain   Patient is a 33 y.o. female presenting with back pain. The history is provided by the patient. No language interpreter was used.  Back Pain Location:  Lumbar spine and sacro-iliac joint Quality:  Aching, stabbing and shooting Radiates to:  R thigh, R knee and R foot Pain severity:  Moderate Onset quality:  Sudden Duration:  2 days Timing:  Constant Progression:  Unchanged Chronicity:  New Context: not falling, not lifting heavy objects and not recent injury   Relieved by:  Nothing Worsened by:  Nothing tried Ineffective treatments: acetaminophen. Associated symptoms: no bladder incontinence, no bowel incontinence, no numbness and no tingling    HPI Comments: [name] is a 33 y.o. female who presents to the Emergency Department complaining of moderate right sided low back pain that radiates down to right hip and down the front of her right leg that began 2 days ago. Patient describes her pain as "excruciating". Patient denies any recent injury that may have caused onset of pain. She states that she has taken an entire bottle of tylenol with minimal relief. Patient denies associated dysuria, bladder incontinence, bowel incontinence, or numbness in right leg.  No past medical history on file. No past surgical history on file. No family history on file. History  Substance Use Topics  . Smoking status: Former Games developer  . Smokeless tobacco: Never Used  . Alcohol Use: 0.0 oz/week    2-3 Cans of beer per week     Comment: social drinker, "on the weekends"   OB History   Grav Para Term Preterm Abortions TAB SAB Ect Mult Living                 Review of  Systems  Gastrointestinal: Negative for bowel incontinence.  Genitourinary: Negative for bladder incontinence.  Musculoskeletal: Positive for back pain.  Neurological: Negative for tingling and numbness.  All other systems reviewed and are negative.  Allergies  Ibuprofen  Home Medications   Prior to Admission medications   Medication Sig Start Date End Date Taking? Authorizing Provider  acetaminophen (TYLENOL) 500 MG tablet Take 500 mg by mouth every 6 (six) hours as needed for pain.    Historical Provider, MD  HYDROcodone-acetaminophen (NORCO/VICODIN) 5-325 MG per tablet Take 1 tablet by mouth every 6 (six) hours as needed. 03/09/14   Fayrene Helper, PA-C  penicillin v potassium (VEETID) 500 MG tablet Take 1 tablet (500 mg total) by mouth 3 (three) times daily. 03/09/14   Fayrene Helper, PA-C   Triage Vitals: BP 138/93  Pulse 88  Temp(Src) 98.3 F (36.8 C) (Oral)  Resp 20  SpO2 100%  Physical Exam  Nursing note and vitals reviewed. Constitutional: She is oriented to person, place, and time. She appears well-developed and well-nourished. No distress.  HENT:  Head: Normocephalic and atraumatic.  Eyes: Conjunctivae and EOM are normal.  Neck: Neck supple. No tracheal deviation present.  Cardiovascular: Normal rate.   Pulmonary/Chest: Effort normal. No respiratory distress.  Musculoskeletal: Normal range of motion.  Midline lumbar spine tenderness. Tenderness in the right SI joint, right buttock. Pain with straight right leg raise. DP pulses intact and equal  bilaterally.  Neurological: She is alert and oriented to person, place, and time.  5/5 and equal lower extremity strength. 2+ and equal patellar reflexes bilaterally. Pt able to dorsiflex bilateral toes and feet with good strength against resistance. Equal sensation bilaterally over thighs and lower legs.   Skin: Skin is warm and dry.  Psychiatric: She has a normal mood and affect. Her behavior is normal.   ED Course  Procedures  (including critical care time)  DIAGNOSTIC STUDIES: Oxygen Saturation is 100% on RA, normal by my interpretation.    COORDINATION OF CARE: 11:10 PM- Discussed plans to order diagnostic imaging of lumbar spine. Will give patient prednisone and percocet for pain management. Pt advised of plan for treatment and pt agrees.  Labs Review Labs Reviewed - No data to display  Imaging Review Dg Lumbar Spine Complete  08/29/2014   CLINICAL DATA:  Low back pain radiating to RIGHT leg.  EXAM: LUMBAR SPINE - COMPLETE 4+ VIEW  COMPARISON:  None.  FINDINGS: Five non rib-bearing lumbar-type vertebral bodies are intact and aligned with maintenance of the lumbar lordosis. Mild L5-S1 disc height loss. No destructive bony lesions. Scattered Schmorl's nodes.  Sacroiliac joints are symmetric. Included prevertebral and paraspinal soft tissue planes are non-suspicious.  IMPRESSION: No acute fracture deformity or malalignment.  Age indeterminate mild L5-S1 disc height loss.   Electronically Signed   By: Awilda Metro   On: 08/29/2014 23:28     EKG Interpretation None     MDM   Final diagnoses:  Sciatica, right   Patient with lower back pain with radiation to the right hip and right leg. No red flags to suggest cauda equina. She's afebrile. Tenderness or weakness in extremities. No urinary or bowel problems. This is a new problem for this patient. She has never had similar symptoms in the past. She denies any injuries. X-ray obtained showing age-indeterminate mild L5 and S1 disc height loss. I suspect her symptoms are either due to radicular pain versus sciatica. Will treat with prednisone, Norco, Flexeril. She was given Percocet and prednisone emergency department. Again no red flags to suggest cauda equina or cord compression. She's stable for discharge home.  Filed Vitals:   08/29/14 2253  BP: 138/93  Pulse: 88  Temp: 98.3 F (36.8 C)  TempSrc: Oral  Resp: 20  SpO2: 100%     I personally performed  the services described in this documentation, which was scribed in my presence. The recorded information has been reviewed and is accurate.  Lottie Mussel, PA-C 08/29/14 2356

## 2014-08-29 NOTE — ED Notes (Signed)
Patient is alert and oriented x3.  She is complaining of right lower back pain that radiates down into Her hip and down her leg.  Patient states that this pain started on Tuesday and has gotten worse over The last two days.  Currently she rates her pain 9 of 10.

## 2014-08-29 NOTE — Discharge Instructions (Signed)
Take prednisone as prescribed until all gone for inflammation. norco for severe pain. Flexeril for spasms. Follow up with primary care doctor for further evaluation and treatment.   Sciatica Sciatica is pain, weakness, numbness, or tingling along the path of the sciatic nerve. The nerve starts in the lower back and runs down the back of each leg. The nerve controls the muscles in the lower leg and in the back of the knee, while also providing sensation to the back of the thigh, lower leg, and the sole of your foot. Sciatica is a symptom of another medical condition. For instance, nerve damage or certain conditions, such as a herniated disk or bone spur on the spine, pinch or put pressure on the sciatic nerve. This causes the pain, weakness, or other sensations normally associated with sciatica. Generally, sciatica only affects one side of the body. CAUSES   Herniated or slipped disc.  Degenerative disk disease.  A pain disorder involving the narrow muscle in the buttocks (piriformis syndrome).  Pelvic injury or fracture.  Pregnancy.  Tumor (rare). SYMPTOMS  Symptoms can vary from mild to very severe. The symptoms usually travel from the low back to the buttocks and down the back of the leg. Symptoms can include:  Mild tingling or dull aches in the lower back, leg, or hip.  Numbness in the back of the calf or sole of the foot.  Burning sensations in the lower back, leg, or hip.  Sharp pains in the lower back, leg, or hip.  Leg weakness.  Severe back pain inhibiting movement. These symptoms may get worse with coughing, sneezing, laughing, or prolonged sitting or standing. Also, being overweight may worsen symptoms. DIAGNOSIS  Your caregiver will perform a physical exam to look for common symptoms of sciatica. He or she may ask you to do certain movements or activities that would trigger sciatic nerve pain. Other tests may be performed to find the cause of the sciatica. These may  include:  Blood tests.  X-rays.  Imaging tests, such as an MRI or CT scan. TREATMENT  Treatment is directed at the cause of the sciatic pain. Sometimes, treatment is not necessary and the pain and discomfort goes away on its own. If treatment is needed, your caregiver may suggest:  Over-the-counter medicines to relieve pain.  Prescription medicines, such as anti-inflammatory medicine, muscle relaxants, or narcotics.  Applying heat or ice to the painful area.  Steroid injections to lessen pain, irritation, and inflammation around the nerve.  Reducing activity during periods of pain.  Exercising and stretching to strengthen your abdomen and improve flexibility of your spine. Your caregiver may suggest losing weight if the extra weight makes the back pain worse.  Physical therapy.  Surgery to eliminate what is pressing or pinching the nerve, such as a bone spur or part of a herniated disk. HOME CARE INSTRUCTIONS   Only take over-the-counter or prescription medicines for pain or discomfort as directed by your caregiver.  Apply ice to the affected area for 20 minutes, 3-4 times a day for the first 48-72 hours. Then try heat in the same way.  Exercise, stretch, or perform your usual activities if these do not aggravate your pain.  Attend physical therapy sessions as directed by your caregiver.  Keep all follow-up appointments as directed by your caregiver.  Do not wear high heels or shoes that do not provide proper support.  Check your mattress to see if it is too soft. A firm mattress may lessen your pain  and discomfort. SEEK IMMEDIATE MEDICAL CARE IF:   You lose control of your bowel or bladder (incontinence).  You have increasing weakness in the lower back, pelvis, buttocks, or legs.  You have redness or swelling of your back.  You have a burning sensation when you urinate.  You have pain that gets worse when you lie down or awakens you at night.  Your pain is worse  than you have experienced in the past.  Your pain is lasting longer than 4 weeks.  You are suddenly losing weight without reason. MAKE SURE YOU:  Understand these instructions.  Will watch your condition.  Will get help right away if you are not doing well or get worse. Document Released: 11/16/2001 Document Revised: 05/23/2012 Document Reviewed: 04/02/2012 [facility] Patient Information 2015 [place], [place]. This information is not intended to replace advice given to you by your health care provider. Make sure you discuss any questions you have with your health care provider.

## 2014-09-03 NOTE — ED Provider Notes (Signed)
Medical screening examination/treatment/procedure(s) were performed by non-physician practitioner and as supervising physician I was immediately available for consultation/collaboration.   EKG Interpretation None        Mone Commisso T Aristea Posada, MD 09/03/14 2327 

## 2015-03-02 ENCOUNTER — Emergency Department (HOSPITAL_COMMUNITY)
Admission: EM | Admit: 2015-03-02 | Discharge: 2015-03-02 | Disposition: A | Discharge status: Home / Self Care | Payer: Medicaid Other | Attending: Emergency Medicine | Admitting: Emergency Medicine

## 2015-03-02 ENCOUNTER — Encounter (HOSPITAL_COMMUNITY): Payer: Self-pay | Admitting: Emergency Medicine

## 2015-03-02 DIAGNOSIS — Z720 Tobacco use: Secondary | ICD-10-CM | POA: Insufficient documentation

## 2015-03-02 DIAGNOSIS — Z792 Long term (current) use of antibiotics: Secondary | ICD-10-CM | POA: Insufficient documentation

## 2015-03-02 DIAGNOSIS — M5416 Radiculopathy, lumbar region: Secondary | ICD-10-CM | POA: Insufficient documentation

## 2015-03-02 MED ORDER — CYCLOBENZAPRINE HCL 10 MG PO TABS: 10.0000 mg | ORAL_TABLET | Freq: Two times a day (BID) | ORAL | Status: DC | PRN

## 2015-03-02 MED ORDER — HYDROCODONE-ACETAMINOPHEN 5-325 MG PO TABS: ORAL_TABLET | ORAL | Status: DC

## 2015-03-02 MED ORDER — HYDROCODONE-ACETAMINOPHEN 5-325 MG PO TABS
1.0000 | ORAL_TABLET | Freq: Once | ORAL | Status: AC
  Administered 2015-03-02: 1 via ORAL
  Filled 2015-03-02: qty 1

## 2015-03-02 MED ORDER — CYCLOBENZAPRINE HCL 10 MG PO TABS
5.0000 mg | ORAL_TABLET | Freq: Once | ORAL | Status: AC
  Administered 2015-03-02: 5 mg via ORAL
  Filled 2015-03-02: qty 1

## 2015-03-02 NOTE — ED Notes (Signed)
Pt states that she has had a shooting pain in her R leg/hip for 'sometime' now that has worsened in the past 2 weeks. Shoots from R hip to R calf. Alert and oriented.

## 2015-03-02 NOTE — ED Notes (Signed)
Pt ambulating independently w/ steady gait on d/c in no acute distress, A&Ox4. Rx given x2  

## 2015-03-02 NOTE — ED Provider Notes (Signed)
CSN: [id number]     Arrival date & time 03/02/15  2011 History   First MD Initiated Contact with Patient 03/02/15 2209     Chief Complaint  Patient presents with  . Leg Pain     (Consider location/radiation/quality/duration/timing/severity/associated sxs/prior Treatment) HPI Comments: Patient presents with complaint of back pain with radiation of pain down her posterior right thigh and into her posterior right calf for 2 weeks. She denies injury at onset. Patient has had similar pain in the past and she was told that she had sciatica. Patient states that the pain is waxing and waning and will "catch" her at times. She denies difficulty walking. No treatments prior to arrival. Patient denies warning symptoms of back pain including: fecal incontinence, urinary retention or overflow incontinence, night sweats, waking from sleep with back pain, unexplained fevers or weight loss, h/o cancer, IVDU, recent trauma. Nothing makes symptoms better.    Patient is a 34 y.o. female presenting with leg pain. The history is provided by the patient.  Leg Pain Associated symptoms: back pain   Associated symptoms: no fever     History reviewed. No pertinent past medical history. History reviewed. No pertinent past surgical history. History reviewed. No pertinent family history. History  Substance Use Topics  . Smoking status: Current Some Day Smoker  . Smokeless tobacco: Never Used  . Alcohol Use: 0.0 oz/week    2-3 Cans of beer per week     Comment: social drinker, "on the weekends"   OB History    No data available     Review of Systems  Constitutional: Negative for fever and unexpected weight change.  Gastrointestinal: Negative for constipation.       Negative for fecal incontinence.   Genitourinary: Negative for dysuria, hematuria, flank pain, vaginal bleeding, vaginal discharge and pelvic pain.       Negative for urinary incontinence or retention.  Musculoskeletal: Positive for back pain.  Negative for arthralgias.  Neurological: Negative for weakness and numbness.       Denies saddle paresthesias.   Allergies  Ibuprofen  Home Medications   Prior to Admission medications   Medication Sig Start Date End Date Taking? Authorizing Provider  acetaminophen (TYLENOL) 500 MG tablet Take 1,000 mg by mouth every 6 (six) hours as needed for pain.    Yes Historical Provider, MD  cyclobenzaprine (FLEXERIL) 10 MG tablet Take 1 tablet (10 mg total) by mouth 2 (two) times daily as needed for muscle spasms. 08/29/14   Tatyana Kirichenko, PA-C  HYDROcodone-acetaminophen (NORCO/VICODIN) 5-325 MG per tablet Take 1 tablet by mouth every 6 (six) hours as needed. 03/09/14   Fayrene HelperBowie Tran, PA-C  HYDROcodone-acetaminophen (NORCO/VICODIN) 5-325 MG per tablet Take 1 tablet by mouth every 4 (four) hours as needed for moderate pain or severe pain. 08/29/14   Tatyana Kirichenko, PA-C  penicillin v potassium (VEETID) 500 MG tablet Take 1 tablet (500 mg total) by mouth 3 (three) times daily. 03/09/14   Fayrene HelperBowie Tran, PA-C  predniSONE (DELTASONE) 10 MG tablet Take 5 tab day 1, take 4 tab day 2, take 3 tab day 3, take 2 tab day 4, and take 1 tab day 5 08/29/14   Tatyana Kirichenko, PA-C   BP 155/100 mmHg  Pulse 96  Temp(Src) 98.1 F (36.7 C) (Oral)  SpO2 100%  LMP 02/01/2015 (Approximate)   Physical Exam  Constitutional: She appears well-developed and well-nourished.  HENT:  Head: Normocephalic and atraumatic.  Eyes: Conjunctivae are normal.  Neck: Normal range of motion.  Neck supple.  Pulmonary/Chest: Effort normal.  Abdominal: Soft. There is no tenderness. There is no CVA tenderness.  Musculoskeletal:       Right hip: Normal.       Left hip: Normal.       Right knee: Normal.       Left knee: Normal.       Right ankle: Normal.       Left ankle: Normal.       Cervical back: Normal.       Thoracic back: Normal.       Lumbar back: She exhibits tenderness. She exhibits normal range of motion and no bony  tenderness.       Back:  No step-off noted with palpation of spine.   Neurological: She is alert. She has normal strength and normal reflexes. No sensory deficit.  5/5 strength in entire lower extremities bilaterally. No sensation deficit.   Skin: Skin is warm and dry. No rash noted.  Psychiatric: She has a normal mood and affect.  Nursing note and vitals reviewed.   ED Course  Procedures (including critical care time) Labs Review Labs Reviewed - No data to display  Imaging Review No results found.   EKG Interpretation None       10:21 PM Patient seen and examined. Work-up initiated. Medications ordered.   Vital signs reviewed and are as follows: Filed Vitals:   03/02/15 2018  BP: 155/100  Pulse: 96  Temp: 98.1 F (36.7 C)    No red flag s/s of low back pain. Patient was counseled on back pain precautions and told to do activity as tolerated but do not lift, push, or pull heavy objects more than 10 pounds for the next week.  Patient counseled to use ice or heat on back for no longer than 15 minutes every hour.   Patient prescribed muscle relaxer and counseled on proper use of muscle relaxant medication.    Patient prescribed narcotic pain medicine and counseled on proper use of narcotic pain medications. Counseled not to combine this medication with others containing tylenol.   Urged patient not to drink alcohol, drive, or perform any other activities that requires focus while taking either of these medications.  Patient urged to follow-up with PCP if pain does not improve with treatment and rest or if pain becomes recurrent. Urged to return with worsening severe pain, loss of bowel or bladder control, trouble walking.   The patient verbalizes understanding and agrees with the plan.  MDM   Final diagnoses:  Lumbar radiculopathy   Patient with back pain, history of same. No neurological deficits. Patient is ambulatory. No warning symptoms of back pain including:  fecal incontinence, urinary retention or overflow incontinence, night sweats, waking from sleep with back pain, unexplained fevers or weight loss, h/o cancer, IVDU, recent trauma. No concern for cauda equina, epidural abscess, or other serious cause of back pain. Conservative measures such as rest, ice/heat and pain medicine indicated with PCP follow-up if no improvement with conservative management.      Renne Crigler, PA-C 03/02/15 2241  Doug Sou, MD 03/03/15 [phone]

## 2015-03-02 NOTE — Discharge Instructions (Signed)
Please read and follow all provided instructions.  Your diagnoses today include:  1. Lumbar radiculopathy    Tests performed today include:  Vital signs - see below for your results today  Medications prescribed:   Vicodin (hydrocodone/acetaminophen) - narcotic pain medication  DO NOT drive or perform any activities that require you to be awake and alert because this medicine can make you drowsy. BE VERY CAREFUL not to take multiple medicines containing Tylenol (also called acetaminophen). Doing so can lead to an overdose which can damage your liver and cause liver failure and possibly death.   Flexeril (cyclobenzaprine) - muscle relaxer medication  DO NOT drive or perform any activities that require you to be awake and alert because this medicine can make you drowsy.   Take any prescribed medications only as directed.  Home care instructions:   Follow any educational materials contained in this packet  Please rest, use ice or heat on your back for the next several days  Do not lift, push, pull anything more than 10 pounds for the next week  Follow-up instructions: Please follow-up with your primary care provider in the next 1 week for further evaluation of your symptoms. See listed referral.   Return instructions:  SEEK IMMEDIATE MEDICAL ATTENTION IF YOU HAVE:  New numbness, tingling, weakness, or problem with the use of your arms or legs  Severe back pain not relieved with medications  Loss control of your bowels or bladder  Increasing pain in any areas of the body (such as chest or abdominal pain)  Shortness of breath, dizziness, or fainting.   Worsening nausea (feeling sick to your stomach), vomiting, fever, or sweats  Any other emergent concerns regarding your health   Additional Information:  Your vital signs today were: BP 155/100 mmHg   Pulse 96   Temp(Src) 98.1 F (36.7 C) (Oral)   SpO2 100%   LMP 02/01/2015 (Approximate) If your blood pressure (BP) was  elevated above 135/85 this visit, please have this repeated by your doctor within one month. --------------

## 2015-07-04 IMAGING — CR DG LUMBAR SPINE COMPLETE 4+V
5 series · 5 of 5 positions shown · non-contrast
Comparison: None.

CLINICAL DATA: Low back pain radiating to RIGHT leg.

EXAM:
LUMBAR SPINE - COMPLETE 4+ VIEW

[t lumbar spine ap]
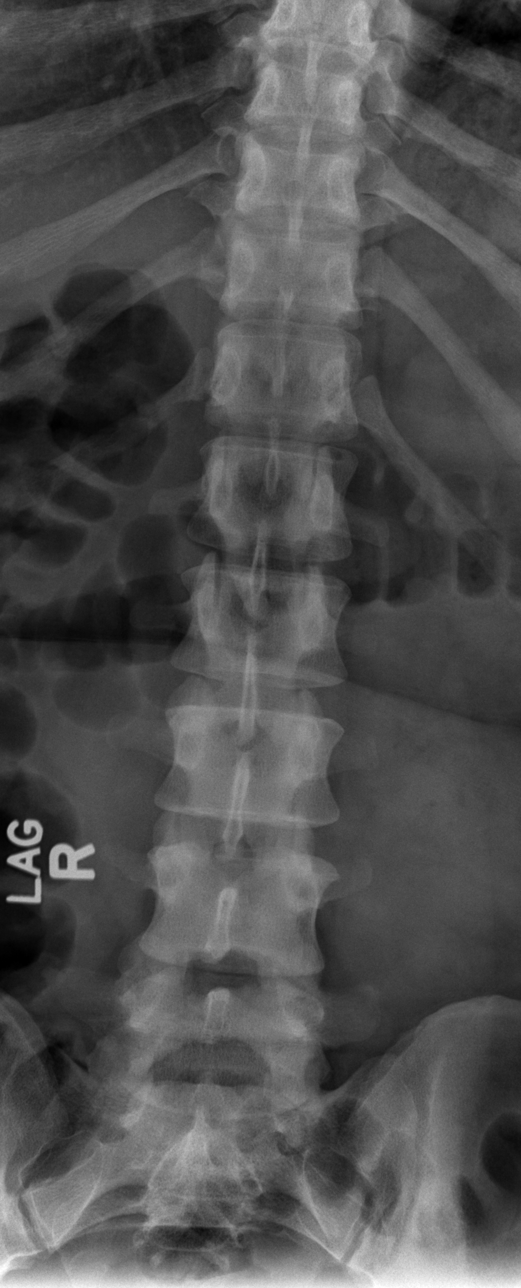

[t lumbar spine obl (1 of 2)]
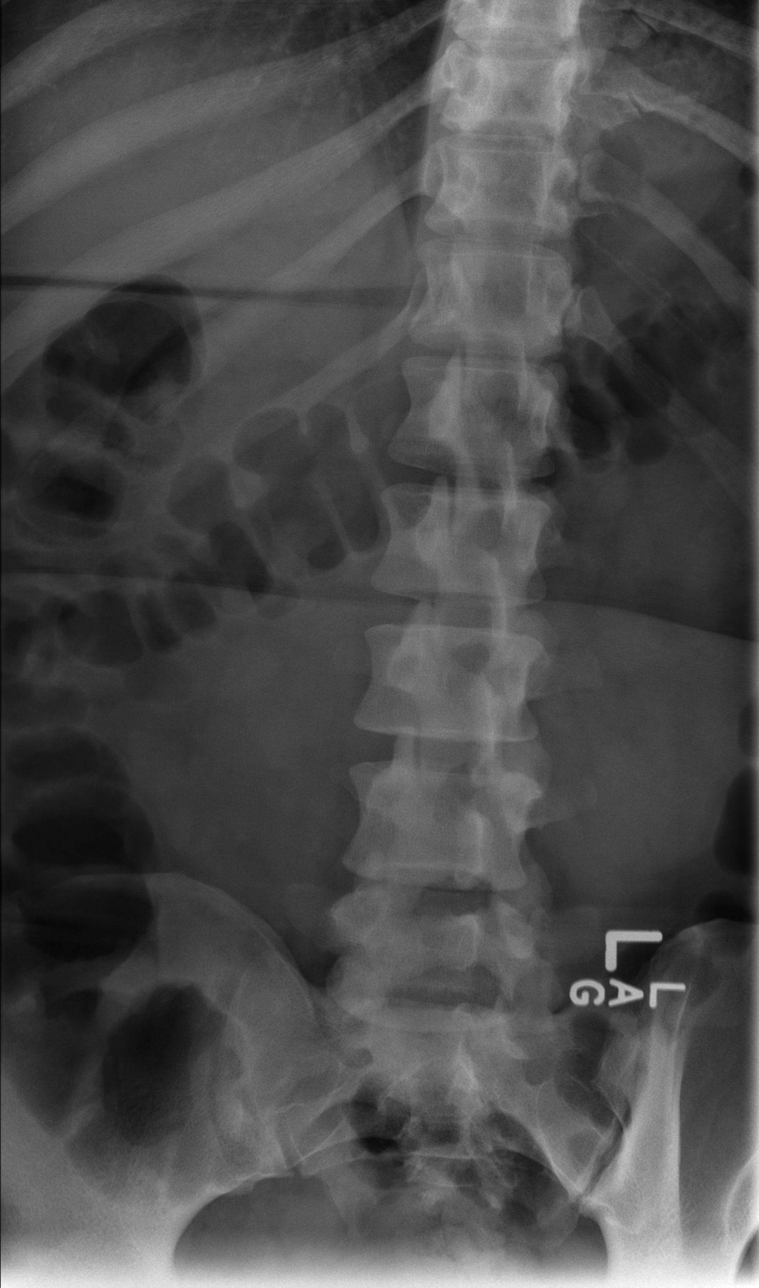

[t lumbar spine obl (2 of 2)]
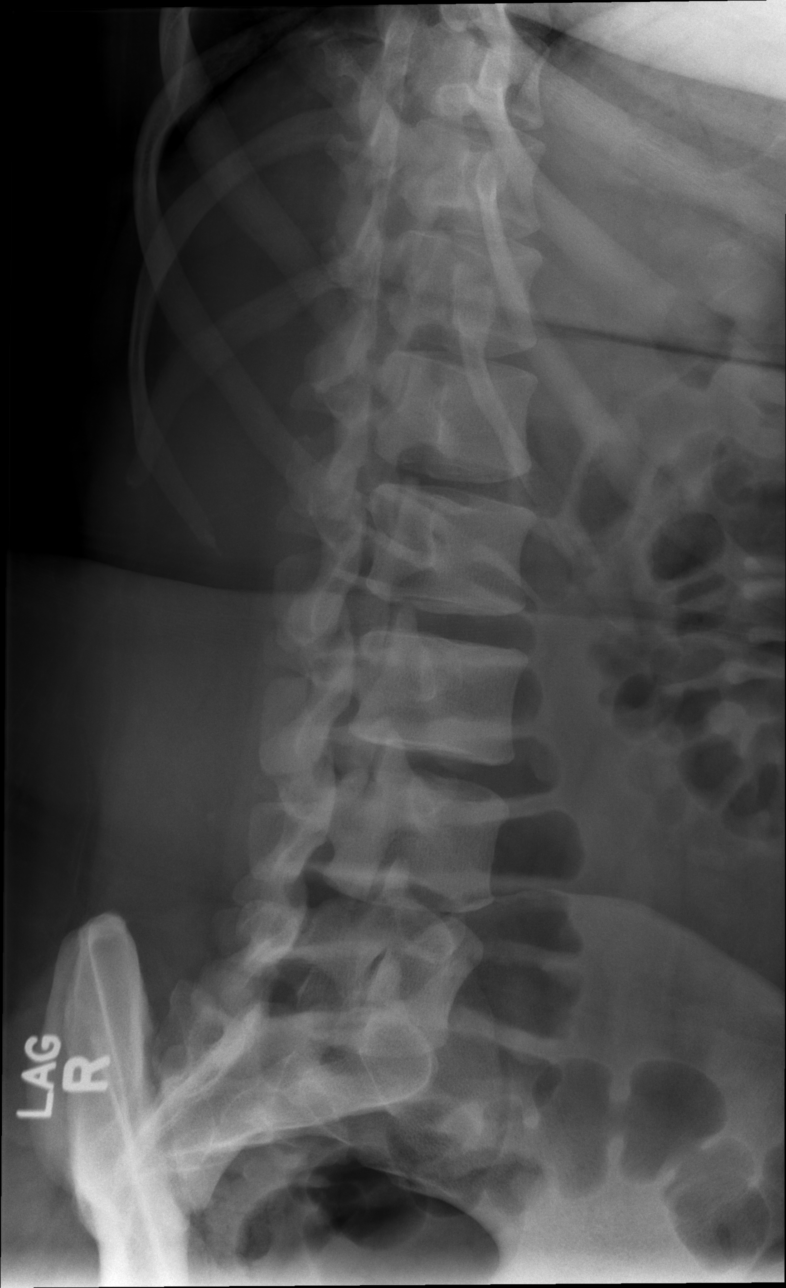

[t lumbar spine lat]
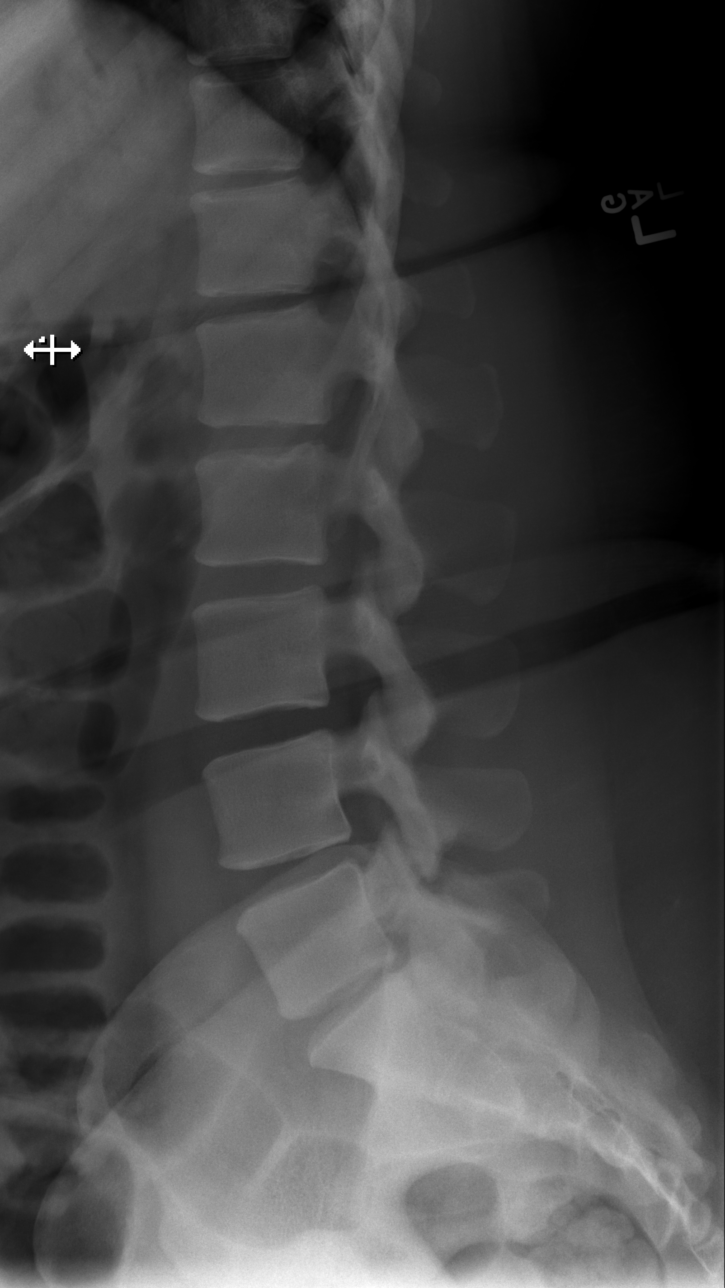

[t lumbar l-5 s-1 spot]
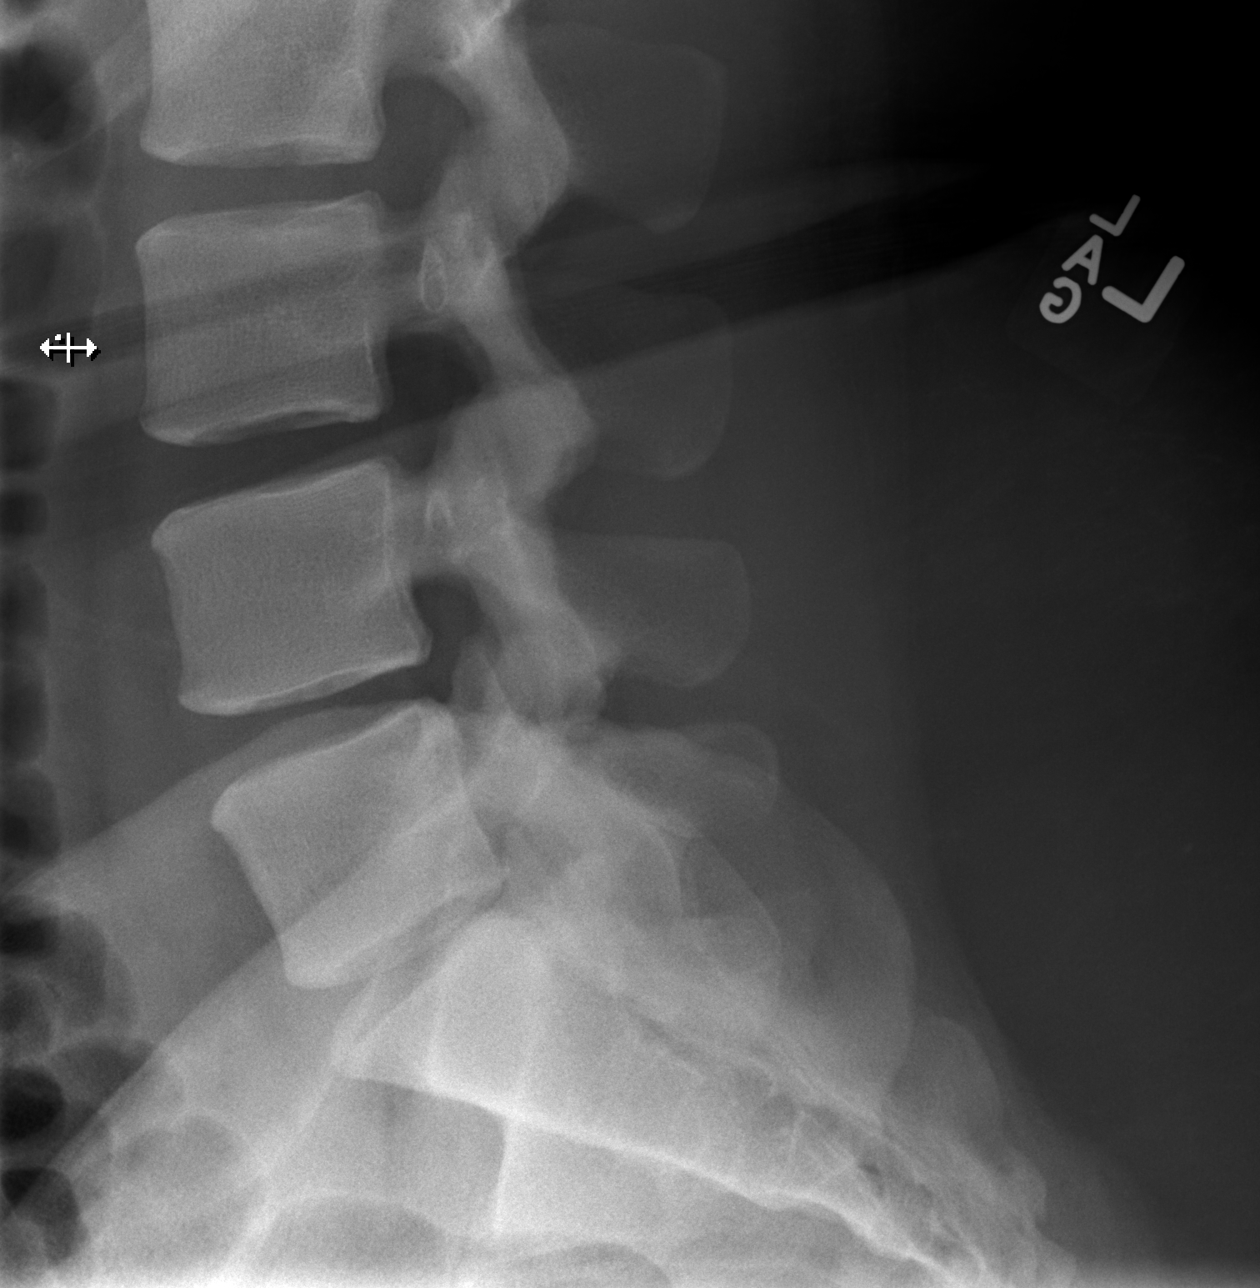

[5 of 5 positions shown; findings below may reference images not displayed]

FINDINGS: Five non rib-bearing lumbar-type vertebral bodies are intact and
aligned with maintenance of the lumbar lordosis. Mild L5-S1 disc
height loss. No destructive bony lesions. Scattered Schmorl's nodes.

Sacroiliac joints are symmetric. Included prevertebral and
paraspinal soft tissue planes are non-suspicious.
IMPRESSION: No acute fracture deformity or malalignment.

Age indeterminate mild L5-S1 disc height loss.

  By: Osmanas Rupert

## 2017-09-05 ENCOUNTER — Emergency Department (HOSPITAL_COMMUNITY)
Admission: EM | Admit: 2017-09-05 | Discharge: 2017-09-05 | Disposition: A | Discharge status: Home / Self Care | Payer: No Typology Code available for payment source | Attending: Emergency Medicine | Admitting: Emergency Medicine

## 2017-09-05 ENCOUNTER — Encounter (HOSPITAL_COMMUNITY): Payer: Self-pay | Admitting: Emergency Medicine

## 2017-09-05 DIAGNOSIS — M545 Low back pain, unspecified: Secondary | ICD-10-CM

## 2017-09-05 DIAGNOSIS — Y939 Activity, unspecified: Secondary | ICD-10-CM | POA: Diagnosis not present

## 2017-09-05 DIAGNOSIS — Y92410 Unspecified street and highway as the place of occurrence of the external cause: Secondary | ICD-10-CM | POA: Insufficient documentation

## 2017-09-05 DIAGNOSIS — Y999 Unspecified external cause status: Secondary | ICD-10-CM | POA: Insufficient documentation

## 2017-09-05 DIAGNOSIS — F17210 Nicotine dependence, cigarettes, uncomplicated: Secondary | ICD-10-CM | POA: Diagnosis not present

## 2017-09-05 DIAGNOSIS — Z79899 Other long term (current) drug therapy: Secondary | ICD-10-CM | POA: Insufficient documentation

## 2017-09-05 MED ORDER — TRAMADOL HCL 50 MG PO TABS: 50.0000 mg | ORAL_TABLET | Freq: Four times a day (QID) | ORAL | 0 refills | Status: DC | PRN

## 2017-09-05 MED ORDER — TRAMADOL HCL 50 MG PO TABS
50.0000 mg | ORAL_TABLET | Freq: Once | ORAL | Status: AC
  Administered 2017-09-05: 50 mg via ORAL
  Filled 2017-09-05: qty 1

## 2017-09-05 MED ORDER — METHOCARBAMOL 500 MG PO TABS: 500.0000 mg | ORAL_TABLET | Freq: Two times a day (BID) | ORAL | 0 refills | Status: DC
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
## 2017-09-05 NOTE — Discharge Instructions (Signed)
You were seen here today for Back Pain: Low back pain is discomfort in the lower back that may be due to injuries to muscles and ligaments around the spine. Occasionally, it may be caused by a problem to a part of the spine called a disc. Your back pain should be treated with medicines listed below as well as back exercises and this back pain should get better over the next 2 weeks. Most patients get completely well in 4 weeks. It is important to know however, if you develop severe or worsening pain, low back pain with fever, numbness, weakness or inability to walk or urinate, you should return to the ER immediately.  Please follow up with your doctor this week for a recheck if still having symptoms.  HOME INSTRUCTIONS Self - care:  The application of heat can help soothe the pain.  Maintaining your daily activities, including walking (this is encouraged), as it will help you get better faster than just staying in bed. Do not life, push, pull anything more than 10 pounds for the next week. I am attaching back exercises that you can do at home to help facilitate your recovery.   Back Exercises - I have attached a handout on back exercises that can be done at home to help facilitate your recovery.   Medications are also useful to help with pain control.   Acetaminophen.  This medication is generally safe, and found over the counter. Take as directed for your age. You should not take more than 8 of the extra strength ( ) pills a day (max dose is  total OVER one day)  Non steroidal anti inflammatory: As your cannot take oral NSAIDs (allergy) I am prescribing you a narcotic pain medication for breakthrough pain.  You are being provided a prescription for opiates (also known as narcotics) for pain control on an ?as needed? basis.  Opiates can be addictive and should only be used when absolutely necessary for pain control when other alternatives do not work.  We recommend you only use them for the  recommended amount of time and only as prescribed.  Please do not take with other sedative medications or alcohol.  Please do not drive, operate machinery, or make important decisions while taking opiates.  Please note that these medications can be addictive and have high abuse potential.  Please keep these medications locked away from children, teenagers or any family members with history of substance abuse. Additionally, these medications may cause constipation - take over the counter stool softeners or add fiber to your diet to treat this (Metamucil, Psyllium Fiber, Colace, Miralax) Further refills will need to be obtained from your primary care doctor and will not be prescribed through the Emergency Department. You will test positive on most drug tests while taking this medication.   Muscle relaxants:  These medications can help with muscle tightness that is a cause of lower back pain.  Most of these medications can cause drowsiness, and it is not safe to drive or use dangerous machinery while taking them. They are primarily helpful when taken at night before sleep. You will need to follow up with your primary healthcare provider or the Orthopedist in 1-2 weeks for reassessment and persistent symptoms.  Be aware that if you develop new symptoms, such as a fever, leg weakness, difficulty with or loss of control of your urine or bowels, abdominal pain, or more severe pain, you will need to seek medical attention and/or return to the Emergency department. Additional  Information:  Your vital signs today were: BP (!) 157/90 (BP Location: Left Arm)    Pulse 74    Temp 98.2 F (36.8 C) (Oral)    Resp 17    Ht  (1.753 m)    Wt 131.5 kg (290 lb)    LMP 08/06/2017 (Exact Date)    SpO2 100%    BMI 42.83 kg/m  If your blood pressure (BP) was elevated above 135/85 this visit, please have this repeated by your doctor within one month. ---------------

## 2018-01-12 ENCOUNTER — Emergency Department (HOSPITAL_COMMUNITY)
Admission: EM | Admit: 2018-01-12 | Discharge: 2018-01-12 | Disposition: A | Discharge status: Home / Self Care | Payer: Medicaid Other | Attending: Emergency Medicine | Admitting: Emergency Medicine

## 2018-01-12 ENCOUNTER — Other Ambulatory Visit: Payer: Self-pay

## 2018-01-12 ENCOUNTER — Encounter (HOSPITAL_COMMUNITY): Payer: Self-pay | Admitting: *Deleted

## 2018-01-12 DIAGNOSIS — I10 Essential (primary) hypertension: Secondary | ICD-10-CM | POA: Insufficient documentation

## 2018-01-12 DIAGNOSIS — Z87891 Personal history of nicotine dependence: Secondary | ICD-10-CM | POA: Insufficient documentation

## 2018-01-12 DIAGNOSIS — Z79899 Other long term (current) drug therapy: Secondary | ICD-10-CM | POA: Insufficient documentation

## 2018-01-12 DIAGNOSIS — R519 Headache, unspecified: Secondary | ICD-10-CM

## 2018-01-12 DIAGNOSIS — Z760 Encounter for issue of repeat prescription: Secondary | ICD-10-CM

## 2018-01-12 DIAGNOSIS — R51 Headache: Secondary | ICD-10-CM | POA: Insufficient documentation

## 2018-01-12 HISTORY — DX: Essential (primary) hypertension: I10

## 2018-01-12 LAB — POC URINE PREG, ED: PREG TEST UR: NEGATIVE

## 2018-01-12 MED ORDER — HYDROCHLOROTHIAZIDE 25 MG PO TABS: 25.0000 mg | ORAL_TABLET | Freq: Every day | ORAL | 1 refills | Status: DC

## 2018-01-12 MED ORDER — AMLODIPINE BESYLATE 2.5 MG PO TABS: 2.5000 mg | ORAL_TABLET | Freq: Every day | ORAL | 1 refills | Status: DC

## 2018-01-12 MED ORDER — KETOROLAC TROMETHAMINE 30 MG/ML IJ SOLN
30.0000 mg | Freq: Once | INTRAMUSCULAR | Status: AC
  Administered 2018-01-12: 30 mg via INTRAMUSCULAR
  Filled 2018-01-12: qty 1

## 2018-01-12 MED FILL — AMLODIPINE BESYLATE 2.5 MG: 2.5 | 30 days supply | Qty: 30 | Fill #0

## 2018-01-12 MED FILL — HYDROCHLOROTHIAZIDE 25 MG T: 25 | 30 days supply | Qty: 30 | Fill #0

## 2018-01-12 NOTE — Discharge Planning (Signed)
Virna Livengood J. Lucretia RoersWood, RN, BSN, [place] [phone]  [facility] 2/26@10 :30.  Spoke with pt at bedside and advised to please arrive 15 min early and take a picture ID and your current medications.  Pt verbalizes understanding of keeping appointment. EDCM advised to [facility]  to be granted the one-time Rx fill at no charge.  Pt instructed to call back if any problems with process. No further EDCM needs identified.  Aarica Wax J. Lucretia RoersWood, RN, BSN, [place] [phone]

## 2018-01-12 NOTE — ED Provider Notes (Signed)
[facility] Provider Note   CSN: [id number] Arrival date & time: 01/12/18  0907     History   Chief Complaint Chief Complaint  Patient presents with  . Cough  . Headache    HPI [name] is a 37 y.o. female.  HPI  Patient is a 36-year female with a history of hypertension presenting for headache above her eyes for approximately a week.  Patient reports that her headache is exactly consistent with prior headaches that she has, and she reports that the precipitant for these headaches is usually being without her blood pressure medication.  Patient reports that she has been without her blood pressure medication for a couple months due to not having a stable primary care provider.  Patient reports she typically takes amlodipine and hydrochlorothiazide.  Patient denies any sudden onset of headache, visual disturbance with headache, fevers, chills, neck stiffness or pain, weakness, numbness of upper or lower extremities, or gait disturbance.  No history of cancer or immunocompromise status.  Patient has been taking BC powder with minimal relief of her headache.  Past Medical History:  Diagnosis Date  . Hypertension     There are no active problems to display for this patient.   History reviewed. No pertinent surgical history.  OB History    No data available       Home Medications    Prior to Admission medications   Medication Sig Start Date End Date Taking? Authorizing Provider  acetaminophen (TYLENOL) 500 MG tablet Take 1,000 mg by mouth every 6 (six) hours as needed for pain.     [provider]  amLODipine (NORVASC) 2.5 MG tablet Take 1 tablet (2.5 mg total) by mouth daily. 01/12/18 02/11/18  Aviva KluverMurray, Kellis Topete B, PA-C  cyclobenzaprine (FLEXERIL) 10 MG tablet Take 1 tablet (10 mg total) by mouth 2 (two) times daily as needed for muscle spasms. 03/02/15   Renne CriglerGeiple, Joshua, PA-C  hydrochlorothiazide (HYDRODIURIL) 25 MG tablet  Take 1 tablet (25 mg total) by mouth daily. 01/12/18   Aviva KluverMurray, Kylii Ennis B, PA-C  HYDROcodone-acetaminophen (NORCO/VICODIN) 5-325 MG per tablet Take 1-2 tablets every 6 hours as needed for severe pain 03/02/15   Renne CriglerGeiple, Joshua, PA-C  methocarbamol (ROBAXIN) 500 MG tablet Take 1 tablet (500 mg total) by mouth 2 (two) times daily. 09/05/17   Maczis, Elmer SowMichael M, PA-C  traMADol (ULTRAM) 50 MG tablet Take 1 tablet (50 mg total) by mouth every 6 (six) hours as needed. 09/05/17   Maczis, Elmer SowMichael M, PA-C    Family History No family history on file.  Social History Social History   Tobacco Use  . Smoking status: Former Games developermoker  . Smokeless tobacco: Never Used  Substance Use Topics  . Alcohol use: Yes    Alcohol/week: 0.0 oz    Types: 2 - 3 Cans of beer per week    Comment: social drinker, "on the weekends"  . Drug use: No     Allergies   Ibuprofen   Review of Systems Review of Systems  Constitutional: Negative for chills and fever.  HENT: Negative for congestion and rhinorrhea.   Eyes: Positive for photophobia. Negative for visual disturbance.  Respiratory: Positive for cough.   Gastrointestinal: Negative for nausea and vomiting.  Musculoskeletal: Negative for gait problem, neck pain and neck stiffness.  Neurological: Positive for headaches. Negative for dizziness, weakness, light-headedness and numbness.     Physical Exam Updated Vital Signs BP (!) 164/96 (BP Location: Right Arm)   Pulse  88   Temp 98.4 F (36.9 C) (Oral)   Resp 16   Ht 5\' 9"  (1.753 m)   Wt 131.5 kg (290 lb)   LMP 01/01/2018 (Approximate)   SpO2 98%   BMI 42.83 kg/m   Physical Exam  Constitutional: She appears well-developed and well-nourished. No distress.  Sitting comfortably in bed.  HENT:  Head: Normocephalic and atraumatic.  Eyes: Conjunctivae are normal. Right eye exhibits no discharge. Left eye exhibits no discharge.  EOMs normal to gross examination.  Neck: Normal range of motion.  Cardiovascular:  Normal rate and regular rhythm.  Intact, 2+ radial pulse.  Pulmonary/Chest:  Normal respiratory effort. Patient converses comfortably. No audible wheeze or stridor.  Abdominal: She exhibits no distension.  Musculoskeletal: Normal range of motion.  Neurological: She is alert. GCS eye subscore is 4. GCS verbal subscore is 5. GCS motor subscore is 6.  Mental Status:  Alert, oriented, thought content appropriate, able to give a coherent history. Speech fluent without evidence of aphasia. Able to follow 2 step commands without difficulty.  Cranial Nerves:  II:  Peripheral visual fields grossly normal, pupils equal, round, reactive to light III,IV, VI: ptosis not present, extra-ocular motions intact bilaterally  V,VII: smile symmetric, facial light touch sensation equal VIII: hearing grossly normal to voice  X: uvula elevates symmetrically  XI: bilateral shoulder shrug symmetric and strong XII: midline tongue extension without fassiculations Motor:  Normal tone. 5/5 in upper and lower extremities bilaterally including strong and equal grip strength and dorsiflexion/plantar flexion Sensory: Pinprick and light touch normal in all extremities.  Deep Tendon Reflexes: 2+ and symmetric in the biceps and patella. Cerebellar: normal finger-to-nose with bilateral upper extremities Gait: normal gait and balance Stance: No pronator drift and good coordination, strength, and position sense with tapping of bilateral arms (performed in sitting position).  Skin: Skin is warm and dry. She is not diaphoretic.  Psychiatric: She has a normal mood and affect. Her behavior is normal. Judgment and thought content normal.  Nursing note and vitals reviewed.    ED Treatments / Results  Labs (all labs ordered are listed, but only abnormal results are displayed) Labs Reviewed  POC URINE PREG, ED    EKG  EKG Interpretation None       Radiology No results found.  Procedures Procedures (including  critical care time)  Medications Ordered in ED Medications  ketorolac (TORADOL) 30 MG/ML injection 30 mg (30 mg Intramuscular Given 01/12/18 1438)     Initial Impression / Assessment and Plan / ED Course  I have reviewed the triage vital signs and the nursing notes.  Pertinent labs & imaging results that were available during my care of the patient were reviewed by me and considered in my medical decision making (see chart for details).    Chart review performed and demonstrates no history of renal dysfunction.  Negative pregnancy test obtained prior to Toradol treatment.  Patient without high-risk features of headache including: sudden onset/thunderclap HA, no similar headache in past, altered mental status, accompanying seizure, headache with exertion, age > 50, history of immunocompromise, neck or shoulder pain, fever, use of anticoagulation, toxic exposure.  Patient is reporting benign historical feature of a headache consistent with all prior headaches.  Patient has a normal complete neurological exam, normal vital signs, normal level of consciousness, no signs of meningismus, is well-appearing/non-toxic appearing, no pain over the temporal arteries.   Imaging with CT/MRI not indicated given history and physical exam findings.   No dangerous or  life-threatening conditions suspected or identified by history, physical exam, and by work-up. No indications for hospitalization identified.   Blood pressure medication refilled today, patient had multiple stable hypertensive readings.  I performed chart review on patient's prior medications and prescribed her previous regimen.  Additionally, case management came to speak with the patient and got her an appointment at the patient care clinic to follow-up for her chronic hypertension.  Patient given prescription refills up until that appointment.  The patient can return precautions for any new or worsening headache with visual changes, fever, neck  stiffness or pain, rashes, or new neurologic symptoms.  Patient is in understanding and agrees with the plan of care.   Final Clinical Impressions(s) / ED Diagnoses   Final diagnoses:  Frontal headache  Encounter for medication refill      Delia Chimes 01/12/18 1918    Little, Ambrose Finland, MD 01/13/18 [phone]

## 2018-01-12 NOTE — ED Triage Notes (Signed)
Pt needs her blood pressure medications.  Pt states she has headache above her eyes.  Cough x 2 months.  No fever.

## 2018-01-12 NOTE — ED Notes (Signed)
Pt reports that she is having a headache because she is out of her BP medications, cannot remember the names of medications but reports that she should be taking two and has not had them for a few months r/t not having insurance. Educated pt on [facility] for getting established with a PCP and importance of having her BP monitored regularly and also taking medication regularly.

## 2018-01-12 NOTE — Discharge Instructions (Signed)
Please see the information and instructions below regarding your visit.  Your diagnoses today include:  1. Frontal headache   2. Encounter for medication refill     You were seen and treated in the emergency department today for headache. Fortunately, your vitals, exam, and work-up is reassuring with no apparent emergent cause for your headache at this time.  Tests performed today include: See side panel of your discharge paperwork for testing performed today. Vital signs are listed at the bottom of these instructions.   Medications prescribed:    Try to avoid daily or regular use of tylenol, aspirin, ibuprofen, and other overt-the-counter pain medications as this can contribute to rebound headaches.   Take any prescribed medications only as prescribed, and any over the counter medications only as directed on the packaging.  Home care instructions:   Drink plenty of fluids at home. This will help with your headache. Be cautious with caffeine use, as this can cause your headache to rebound when the effects wear off. If you drink more than 2 cups of coffee/caffeinated tea, or caffeinated soda per day, I suggest you wean down that amount.  Please follow any educational materials contained in this packet.   Follow-up instructions: Please follow-up with your primary care provider as soon as possible for further evaluation of your symptoms if they are not completely improved.   Return instructions:  Please return to the Emergency Department if you experience worsening symptoms. It is VERY important that you monitor your symptoms at home. If you develop worsening headache, new fever, new neck stiffness, rash, focal weakness or numbness, or any other new or concerning symptoms, please return to the ED immediately, as these may be signs that your headache has become a potentially serious and life-threatening condition.  Please return if you have any other emergent concerns.  Additional  Information:   Your vital signs today were: BP (!) 167/101 (BP Location: Right Arm)    Pulse 68    Temp 98.4 F (36.9 C) (Oral)    Resp 18    Ht 5\' 9"  (1.753 m)    Wt 131.5 kg (290 lb)    LMP 01/01/2018 (Approximate)    SpO2 100%    BMI 42.83 kg/m  If your blood pressure (BP) was elevated on multiple readings during this visit above 130 for the top number or above 80 for the bottom number, please have this repeated by your primary care provider within one month. --------------  Thank you for allowing us to participate in your care today.

## 2018-01-31 ENCOUNTER — Ambulatory Visit (INDEPENDENT_AMBULATORY_CARE_PROVIDER_SITE_OTHER): Payer: Self-pay | Admitting: Physician Assistant

## 2018-01-31 ENCOUNTER — Encounter (INDEPENDENT_AMBULATORY_CARE_PROVIDER_SITE_OTHER): Payer: Self-pay | Admitting: Physician Assistant

## 2018-01-31 VITALS — BP 135/84 | HR 78 | Temp 98.5°F | Resp 18 | Ht 69.0 in | Wt 303.0 lb

## 2018-01-31 DIAGNOSIS — R5383 Other fatigue: Secondary | ICD-10-CM

## 2018-01-31 DIAGNOSIS — I10 Essential (primary) hypertension: Secondary | ICD-10-CM

## 2018-01-31 DIAGNOSIS — Z114 Encounter for screening for human immunodeficiency virus [HIV]: Secondary | ICD-10-CM

## 2018-01-31 DIAGNOSIS — Z23 Encounter for immunization: Secondary | ICD-10-CM

## 2018-01-31 MED ORDER — HYDROCHLOROTHIAZIDE 12.5 MG PO TABS: 12.5000 mg | ORAL_TABLET | Freq: Every day | ORAL | 3 refills | Status: DC

## 2018-01-31 MED ORDER — AMLODIPINE BESYLATE 5 MG PO TABS: 5.0000 mg | ORAL_TABLET | Freq: Every day | ORAL | 3 refills | Status: DC

## 2018-01-31 NOTE — Progress Notes (Signed)
Subjective:  Patient ID: [name], female    DOB: Apr 16, 1981  Age: 37 y.o. MRN: [id number]  CC: hospital f/u  HPI [name] is a 37 y.o. female with a medical history of HTN and LBP w/sciatica presents on hospital f/u for a frontal headache.  Feels well generally except for some fatigue. Headache is resolved once she began taking anti-hypertensives again. Says HCTZ 25 mg is too strong and make her feel somewhat ill. Has been taking half a tablet with less ill effect. Does not report any other symptoms or complaints.        Outpatient Medications Prior to Visit  Medication Sig Dispense Refill  . acetaminophen (TYLENOL) 500 MG tablet Take 1,000 mg by mouth every 6 (six) hours as needed for pain.     Marland Kitchen. amLODipine (NORVASC) 2.5 MG tablet Take 1 tablet (2.5 mg total) by mouth daily. 30 tablet 1  . hydrochlorothiazide (HYDRODIURIL) 25 MG tablet Take 1 tablet (25 mg total) by mouth daily. 30 tablet 1  . cyclobenzaprine (FLEXERIL) 10 MG tablet Take 1 tablet (10 mg total) by mouth 2 (two) times daily as needed for muscle spasms. 14 tablet 0  . HYDROcodone-acetaminophen (NORCO/VICODIN) 5-325 MG per tablet Take 1-2 tablets every 6 hours as needed for severe pain 12 tablet 0  . methocarbamol (ROBAXIN) 500 MG tablet Take 1 tablet (500 mg total) by mouth 2 (two) times daily. 20 tablet 0  . traMADol (ULTRAM) 50 MG tablet Take 1 tablet (50 mg total) by mouth every 6 (six) hours as needed. 10 tablet 0   No facility-administered medications prior to visit.      ROS Review of Systems  Constitutional: Positive for malaise/fatigue. Negative for chills and fever.  Eyes: Negative for blurred vision.  Respiratory: Negative for shortness of breath.   Cardiovascular: Negative for chest pain and palpitations.  Gastrointestinal: Negative for abdominal pain and nausea.  Genitourinary: Negative for dysuria and hematuria.  Musculoskeletal: Negative for joint pain and myalgias.  Skin:  Negative for rash.  Neurological: Negative for tingling and headaches.  Psychiatric/Behavioral: Negative for depression. The patient is not nervous/anxious.     Objective:  BP 135/84 (BP Location: Left Arm, Patient Position: Sitting, Cuff Size: Large)   Pulse 78   Temp 98.5 F (36.9 C) (Oral)   Resp 18   Ht 5\' 9"  (1.753 m)   Wt (!) 303 lb (137.4 kg)   LMP 01/01/2018 (Approximate)   SpO2 100%   BMI 44.75 kg/m   BP/Weight 01/31/2018 01/12/2018 09/05/2017  Systolic BP 135 164 157  Diastolic BP 84 96 90  Wt. (Lbs) 303 290 290  BMI 44.75 42.83 42.83      Physical Exam  Constitutional: She is oriented to person, place, and time.  Well developed, obese, NAD, polite  HENT:  Head: Normocephalic and atraumatic.  Eyes: Conjunctivae are normal. No scleral icterus.  Neck: Normal range of motion. Neck supple. No thyromegaly present.  Cardiovascular: Normal rate, regular rhythm and normal heart sounds.  Pulmonary/Chest: Effort normal and breath sounds normal.  Musculoskeletal: She exhibits no edema.  Neurological: She is alert and oriented to person, place, and time. No cranial nerve deficit. Coordination normal.  Skin: Skin is warm and dry. No rash noted. No erythema. No pallor.  Psychiatric: She has a normal mood and affect. Her behavior is normal. Thought content normal.  Vitals reviewed.    Assessment & Plan:   1. Hypertension, unspecified type - CBC with Differential -  Comprehensive metabolic panel - Lipid panel - TSH  2. Fatigue, unspecified type - TSH  3. Need for Tdap vaccination - Tdap vaccine greater than or equal to 7yo IM  4. Need for prophylactic vaccination and inoculation against influenza - Flu Vaccine QUAD 6+ mos PF IM (Fluarix Quad PF)  5. Screening for HIV (human immunodeficiency virus) - HIV antibody   Meds ordered this encounter  Medications  . hydrochlorothiazide (HYDRODIURIL) 12.5 MG tablet    Sig: Take 1 tablet (12.5 mg total) by mouth daily.     Dispense:  90 tablet    Refill:  3    Order Specific Question:   Supervising Provider    Answer:   Quentin Angst [id number]  . amLODipine (NORVASC) 5 MG tablet    Sig: Take 1 tablet (5 mg total) by mouth daily.    Dispense:  90 tablet    Refill:  3    Order Specific Question:   Supervising Provider    Answer:   Quentin Angst [id number]    Follow-up: Return in about 4 weeks (around 02/28/2018) for HTN and PAP.   Loletta Specter PA

## 2018-01-31 NOTE — Patient Instructions (Signed)
DASH Eating Plan DASH stands for "Dietary Approaches to Stop Hypertension." The DASH eating plan is a healthy eating plan that has been shown to reduce high blood pressure (hypertension). It may also reduce your risk for type 2 diabetes, heart disease, and stroke. The DASH eating plan may also help with weight loss. What are tips for following this plan? General guidelines  Avoid eating more than 2,300 mg (milligrams) of salt (sodium) a day. If you have hypertension, you may need to reduce your sodium intake to 1,500 mg a day.  Limit alcohol intake to no more than 1 drink a day for nonpregnant women and 2 drinks a day for men. One drink equals 12 oz of beer, 5 oz of wine, or 1 oz of hard liquor.  Work with your health care provider to maintain a healthy body weight or to lose weight. Ask what an ideal weight is for you.  Get at least 30 minutes of exercise that causes your heart to beat faster (aerobic exercise) most days of the week. Activities may include walking, swimming, or biking.  Work with your health care provider or diet and nutrition specialist (dietitian) to adjust your eating plan to your individual calorie needs. Reading food labels  Check food labels for the amount of sodium per serving. Choose foods with less than 5 percent of the Daily Value of sodium. Generally, foods with less than 300 mg of sodium per serving fit into this eating plan.  To find whole grains, look for the word "whole" as the first word in the ingredient list. Shopping  Buy products labeled as "low-sodium" or "no salt added."  Buy fresh foods. Avoid canned foods and premade or frozen meals. Cooking  Avoid adding salt when cooking. Use salt-free seasonings or herbs instead of table salt or sea salt. Check with your health care provider or pharmacist before using salt substitutes.  Do not fry foods. Cook foods using healthy methods such as baking, boiling, grilling, and broiling instead.  Cook with  heart-healthy oils, such as olive, canola, soybean, or sunflower oil. Meal planning   Eat a balanced diet that includes: ? 5 or more servings of fruits and vegetables each day. At each meal, try to fill half of your plate with fruits and vegetables. ? Up to 6-8 servings of whole grains each day. ? Less than 6 oz of lean meat, poultry, or fish each day. A 3-oz serving of meat is about the same size as a deck of cards. One egg equals 1 oz. ? 2 servings of low-fat dairy each day. ? A serving of nuts, seeds, or beans 5 times each week. ? Heart-healthy fats. Healthy fats called Omega-3 fatty acids are found in foods such as flaxseeds and coldwater fish, like sardines, salmon, and mackerel.  Limit how much you eat of the following: ? Canned or prepackaged foods. ? Food that is high in trans fat, such as fried foods. ? Food that is high in saturated fat, such as fatty meat. ? Sweets, desserts, sugary drinks, and other foods with added sugar. ? Full-fat dairy products.  Do not salt foods before eating.  Try to eat at least 2 vegetarian meals each week.  Eat more home-cooked food and less restaurant, buffet, and fast food.  When eating at a restaurant, ask that your food be prepared with less salt or no salt, if possible. What foods are recommended? The items listed may not be a complete list. Talk with your dietitian about what   dietary choices are best for you. Grains Whole-grain or whole-wheat bread. Whole-grain or whole-wheat pasta. Brown rice. Oatmeal. Quinoa. Bulgur. Whole-grain and low-sodium cereals. Pita bread. Low-fat, low-sodium crackers. Whole-wheat flour tortillas. Vegetables Fresh or frozen vegetables (raw, steamed, roasted, or grilled). Low-sodium or reduced-sodium tomato and vegetable juice. Low-sodium or reduced-sodium tomato sauce and tomato paste. Low-sodium or reduced-sodium canned vegetables. Fruits All fresh, dried, or frozen fruit. Canned fruit in natural juice (without  added sugar). Meat and other protein foods Skinless chicken or turkey. Ground chicken or turkey. Pork with fat trimmed off. Fish and seafood. Egg whites. Dried beans, peas, or lentils. Unsalted nuts, nut butters, and seeds. Unsalted canned beans. Lean cuts of beef with fat trimmed off. Low-sodium, lean deli meat. Dairy Low-fat (1%) or fat-free (skim) milk. Fat-free, low-fat, or reduced-fat cheeses. Nonfat, low-sodium ricotta or cottage cheese. Low-fat or nonfat yogurt. Low-fat, low-sodium cheese. Fats and oils Soft margarine without trans fats. Vegetable oil. Low-fat, reduced-fat, or light mayonnaise and salad dressings (reduced-sodium). Canola, safflower, olive, soybean, and sunflower oils. Avocado. Seasoning and other foods Herbs. Spices. Seasoning mixes without salt. Unsalted popcorn and pretzels. Fat-free sweets. What foods are not recommended? The items listed may not be a complete list. Talk with your dietitian about what dietary choices are best for you. Grains Baked goods made with fat, such as croissants, muffins, or some breads. Dry pasta or rice meal packs. Vegetables Creamed or fried vegetables. Vegetables in a cheese sauce. Regular canned vegetables (not low-sodium or reduced-sodium). Regular canned tomato sauce and paste (not low-sodium or reduced-sodium). Regular tomato and vegetable juice (not low-sodium or reduced-sodium). Pickles. Olives. Fruits Canned fruit in a light or heavy syrup. Fried fruit. Fruit in cream or butter sauce. Meat and other protein foods Fatty cuts of meat. Ribs. Fried meat. Bacon. Sausage. Bologna and other processed lunch meats. Salami. Fatback. Hotdogs. Bratwurst. Salted nuts and seeds. Canned beans with added salt. Canned or smoked fish. Whole eggs or egg yolks. Chicken or turkey with skin. Dairy Whole or 2% milk, cream, and half-and-half. Whole or full-fat cream cheese. Whole-fat or sweetened yogurt. Full-fat cheese. Nondairy creamers. Whipped toppings.  Processed cheese and cheese spreads. Fats and oils Butter. Stick margarine. Lard. Shortening. Ghee. Bacon fat. Tropical oils, such as coconut, palm kernel, or palm oil. Seasoning and other foods Salted popcorn and pretzels. Onion salt, garlic salt, seasoned salt, table salt, and sea salt. Worcestershire sauce. Tartar sauce. Barbecue sauce. Teriyaki sauce. Soy sauce, including reduced-sodium. Steak sauce. Canned and packaged gravies. Fish sauce. Oyster sauce. Cocktail sauce. Horseradish that you find on the shelf. Ketchup. Mustard. Meat flavorings and tenderizers. Bouillon cubes. Hot sauce and Tabasco sauce. Premade or packaged marinades. Premade or packaged taco seasonings. Relishes. Regular salad dressings. Where to find more information:  National Heart, Lung, and Blood Institute: www.nhlbi.nih.gov  American Heart Association: www.heart.org Summary  The DASH eating plan is a healthy eating plan that has been shown to reduce high blood pressure (hypertension). It may also reduce your risk for type 2 diabetes, heart disease, and stroke.  With the DASH eating plan, you should limit salt (sodium) intake to 2,300 mg a day. If you have hypertension, you may need to reduce your sodium intake to 1,500 mg a day.  When on the DASH eating plan, aim to eat more fresh fruits and vegetables, whole grains, lean proteins, low-fat dairy, and heart-healthy fats.  Work with your health care provider or diet and nutrition specialist (dietitian) to adjust your eating plan to your individual   calorie needs. This information is not intended to replace advice given to you by your health care provider. Make sure you discuss any questions you have with your health care provider. Document Released: 11/11/2011 Document Revised: 11/15/2016 Document Reviewed: 11/15/2016 Elsevier Interactive Patient Education  2018 [employer]  

## 2018-02-01 ENCOUNTER — Other Ambulatory Visit (INDEPENDENT_AMBULATORY_CARE_PROVIDER_SITE_OTHER): Payer: Self-pay | Admitting: Physician Assistant

## 2018-02-01 DIAGNOSIS — Z79899 Other long term (current) drug therapy: Secondary | ICD-10-CM

## 2018-02-01 DIAGNOSIS — D649 Anemia, unspecified: Secondary | ICD-10-CM

## 2018-02-01 LAB — CBC WITH DIFFERENTIAL/PLATELET
BASOS: 1 %
Basophils Absolute: 0.1 10*3/uL (ref 0.0–0.2)
EOS (ABSOLUTE): 0.1 10*3/uL (ref 0.0–0.4)
Eos: 2 %
HEMOGLOBIN: 9.2 g/dL — AB (ref 11.1–15.9)
Hematocrit: 32.1 % — ABNORMAL LOW (ref 34.0–46.6)
IMMATURE GRANS (ABS): 0 10*3/uL (ref 0.0–0.1)
IMMATURE GRANULOCYTES: 0 %
LYMPHS: 32 %
Lymphocytes Absolute: 2.6 10*3/uL (ref 0.7–3.1)
MCH: 22.6 pg — AB (ref 26.6–33.0)
MCHC: 28.7 g/dL — ABNORMAL LOW (ref 31.5–35.7)
MCV: 79 fL (ref 79–97)
MONOCYTES: 5 %
Monocytes Absolute: 0.4 10*3/uL (ref 0.1–0.9)
NEUTROS ABS: 4.9 10*3/uL (ref 1.4–7.0)
NEUTROS PCT: 60 %
PLATELETS: 362 10*3/uL (ref 150–379)
RBC: 4.07 x10E6/uL (ref 3.77–5.28)
RDW: 20.3 % — ABNORMAL HIGH (ref 12.3–15.4)
WBC: 8.1 10*3/uL (ref 3.4–10.8)

## 2018-02-01 LAB — COMPREHENSIVE METABOLIC PANEL
ALT: 16 IU/L (ref 0–32)
AST: 17 IU/L (ref 0–40)
Albumin/Globulin Ratio: 1.5 (ref 1.2–2.2)
Albumin: 4.3 g/dL (ref 3.5–5.5)
Alkaline Phosphatase: 65 IU/L (ref 39–117)
BUN/Creatinine Ratio: 13 (ref 9–23)
BUN: 10 mg/dL (ref 6–20)
Bilirubin Total: 0.3 mg/dL (ref 0.0–1.2)
CALCIUM: 9.7 mg/dL (ref 8.7–10.2)
CO2: 24 mmol/L (ref 20–29)
CREATININE: 0.8 mg/dL (ref 0.57–1.00)
Chloride: 104 mmol/L (ref 96–106)
GFR calc Af Amer: 110 mL/min/{1.73_m2} (ref >59)
GFR, EST NON AFRICAN AMERICAN: 95 mL/min/{1.73_m2} (ref >59)
GLOBULIN, TOTAL: 2.9 g/dL (ref 1.5–4.5)
Glucose: 97 mg/dL (ref 65–99)
Potassium: 4.2 mmol/L (ref 3.5–5.2)
SODIUM: 141 mmol/L (ref 134–144)
Total Protein: 7.2 g/dL (ref 6.0–8.5)

## 2018-02-01 LAB — LIPID PANEL
CHOL/HDL RATIO: 2.5 ratio (ref 0.0–4.4)
Cholesterol, Total: 151 mg/dL (ref 100–199)
HDL: 60 mg/dL (ref >39)
LDL CALC: 71 mg/dL (ref 0–99)
TRIGLYCERIDES: 98 mg/dL (ref 0–149)
VLDL CHOLESTEROL CAL: 20 mg/dL (ref 5–40)

## 2018-02-01 LAB — TSH: TSH: 1.86 u[IU]/mL (ref 0.450–4.500)

## 2018-02-01 LAB — HIV ANTIBODY (ROUTINE TESTING W REFLEX): HIV Screen 4th Generation wRfx: NONREACTIVE

## 2018-02-01 MED ORDER — DOCUSATE SODIUM 50 MG PO CAPS: 50.0000 mg | ORAL_CAPSULE | Freq: Every day | ORAL | 0 refills | Status: DC

## 2018-02-01 MED ORDER — FERROUS SULFATE 325 (65 FE) MG PO TBEC: 325.0000 mg | DELAYED_RELEASE_TABLET | Freq: Three times a day (TID) | ORAL | 0 refills | Status: DC

## 2018-02-02 ENCOUNTER — Telehealth (INDEPENDENT_AMBULATORY_CARE_PROVIDER_SITE_OTHER): Payer: Self-pay | Admitting: *Deleted

## 2018-02-02 NOTE — Telephone Encounter (Signed)
-----   Message from Loletta Specteroger David Gomez, PA-C sent at 02/01/2018  1:27 PM EST ----- All results normal except for mild anemia. I have sent iron pills and stool softener to [facility] on [address]

## 2018-02-02 NOTE — Telephone Encounter (Signed)
Patient verified DOB Patient is aware of anemia being mild and needing to pick up iron supplements and stool softner from [facility]. Patient had no further questions.

## 2018-02-22 ENCOUNTER — Other Ambulatory Visit: Payer: Self-pay

## 2018-02-22 ENCOUNTER — Other Ambulatory Visit (HOSPITAL_COMMUNITY)
Admission: RE | Admit: 2018-02-22 | Discharge: 2018-02-22 | Disposition: A | Discharge status: Home / Self Care | Payer: Medicaid Other | Source: Ambulatory Visit | Attending: Physician Assistant | Admitting: Physician Assistant

## 2018-02-22 ENCOUNTER — Ambulatory Visit (INDEPENDENT_AMBULATORY_CARE_PROVIDER_SITE_OTHER): Payer: Self-pay | Admitting: Physician Assistant

## 2018-02-22 ENCOUNTER — Encounter (INDEPENDENT_AMBULATORY_CARE_PROVIDER_SITE_OTHER): Payer: Self-pay | Admitting: Physician Assistant

## 2018-02-22 VITALS — BP 153/89 | HR 71 | Temp 98.0°F | Wt 306.4 lb

## 2018-02-22 DIAGNOSIS — B9689 Other specified bacterial agents as the cause of diseases classified elsewhere: Secondary | ICD-10-CM | POA: Diagnosis not present

## 2018-02-22 DIAGNOSIS — Z124 Encounter for screening for malignant neoplasm of cervix: Secondary | ICD-10-CM

## 2018-02-22 DIAGNOSIS — I10 Essential (primary) hypertension: Secondary | ICD-10-CM

## 2018-02-22 DIAGNOSIS — F418 Other specified anxiety disorders: Secondary | ICD-10-CM

## 2018-02-22 MED ORDER — ESCITALOPRAM OXALATE 10 MG PO TABS: 10.0000 mg | ORAL_TABLET | Freq: Every day | ORAL | 1 refills | Status: DC

## 2018-02-22 MED ORDER — CLONAZEPAM 1 MG PO TABS: 1.0000 mg | ORAL_TABLET | Freq: Every day | ORAL | 0 refills | Status: DC

## 2018-02-22 MED FILL — ESCITALOPRAM 10 MG TABLET: 10 | 30 days supply | Qty: 30 | Fill #0

## 2018-02-22 NOTE — Patient Instructions (Signed)
Community Resources  Advocacy/Legal Legal Aid [place]:  1-866-219-5262  /  336-272-0148  Family Justice Center:  336-641-7233  [facility] 24-hr Crisis line:  336-273-7273  Women's Resource Center, [place]:  336-275-6090  Court Watch (custody):  336-275-2346  [place] [facility]:   336-279-9299    Baby & Breastfeeding Car Seat Inspection @ Various [place] Fire Depts.- call 336-373-2177  [place] [facility]  336-832-6860  [facility] 336-878-6712  WIC: 336-641-3663 ([place]);  336-641-7571 ([place])  La Leche League:  1-877-452-5321   Childcare [place] [facility]: 336-369-5097 ([place]) / 336-887-8224 ([place])  - Child Care Resources/ Referrals/ Scholarships  - Head Start/ Early Head Start (call or apply online)  [place] DHHS: [place] Pre-K :  1-800-859-0829 / 336-274-5437   Employment / Job Search [facility] [place]: 336-275-6090 / [address]  [place] [employer] (JobLink): 336-373-5922 ([place]) / 336-882-4141 ([place])  [facility] Community Resource/ Career Center: 336-275-9801 / 336-282-7307  [facility] Job & Career Center: 336-373-3764  DHHS Work First: 336-641-3447 ([place]) / 336-641-3447 ([place])  [facility] [place]:  336-676-5871   Financial Assistance [facility] [facility]:  336-553-2657  [employer]: 336-235-0368  [employer] (furniture):  336-370-4002  [place] [facility]: 336-373-4264  Low Income Energy Assistance  336-641-3000   Food Assistance DHHS- SNAP/ Food Stamps: 336-641-4588  WIC: [place]- 336-641-3663 ;  [place] 336-641-7571  Little Green Book- Free Meals  Little Blue Book- Free Food Pantries  During the summer, text "FOOD" to 877877   General Health / Clinics (Adults) Orange Card (for Adults) through [facility]: (336) 895-4900  [facility]:   336-832-8035  [facility]:   336-832-4444  Health Department:  336-641-3245  [facility]:  336-415-3877 / 336-641-2100  Planned [facility] of [place]:   336-373-0678  [facility]:   336-334-4822 x 50251   Housing [place] Housing Coalition:   336-691-9521  [place] Housing Authority:  336-275-8501  Affordable Housing Managemnt:  336-273-0568   Immigrant/ Refugee Center for [place]ians (UNCG):  336-256-1065  Faith [employer] [facility]:  336-379-0037  [employer]:  336-937-4701  Church World Services:  336-617-0381  African Services Coalition:  336-574-2677   LGBTQ YouthSAFE  www.youthsafe[place].org  PFLAG  336-541-6754 / info@[place].org  The Trevor Project:  1-866-488-7386   Mental Health/ Substance Use [facility]  336-387-6161  [facility]:  336-832-9700 or 1-800-711-2635  [facility] of Care:  336-271-5888  [facility]:  336-294-1349  [facility]:  336-542-2884  [facility] (walk-ins)  336-676-6840 / [address]  [place]:  800-449-1287  Alcoholics Anonymous:  336-854-4278  Narcotics Anonymous:  800-365-1036  Quit Smoking Hotline:  800-QUIT-NOW (800-784-8669)   Parenting Children's Home Society:  800-632-1400  [place]: Education Center & Support Groups:  336-832-6682  YWCA: 336-273-3461  UNCG: Bringing Out the Best:  336-334-3120               Thriving at Three (Hispanic families): 336-256-1066  Healthy Start ([facility]):  336-387-6161 x2288  Parents as Teachers:  336-691-0024  [place] [facility]- Learning Together (Immigrants): 336-369-5001   Poison Control 800-222-1222  Sports & Recreation [employer] Open Doors Application: ymcanwnc.org/join/open-doors-financial-assistance/  City of [place] Recreation Centers: http://www.[place].gov/index.aspx?page=3615   Special Needs Family Support Network:  336-832-6507  Autism Society of :   336-333-0197 x1402 or x1412 /  800-785-1035  TEACCH [place]:  336-334-5773     ARC of [place]:  336-373-1076  Children's Developmental Service Agency (CDSA):  336-334-5601  CC4C (Care Coordination for Children):  336-641-7641   Transportation Medicaid Transportation: 336-641-4848 to apply  [place] Transit Authority: 336-335-6499 (reduced-fare bus ID to Medicaid/ Medicare/ Orange Card)  SCAT Paratransit services: Eligible riders only, call 336-333-6589 for application   Tutoring/Mentoring Black Child Development Institute: 336-230-2138  Big Brothers/ Big Sisters: 336-378-9100 ([place])  336-882-4167 ([place])  ACES through child's school: 336-370-2321  [employer] Achievers: contact your local Y  SHIELD Mentor Program: 336-337-2771     Living With Depression Everyone experiences occasional disappointment, sadness, and loss in their lives. When you are feeling down, blue, or sad for at least 2 weeks in a row, it may mean that you have depression. Depression can affect your thoughts and feelings, relationships, daily activities, and physical health. It is caused by changes in the way your brain functions. If you receive a diagnosis of depression, your health care provider will tell you which type of depression you have and what treatment options are available to you. If you are living with depression, there are ways to help you recover from it and also ways to prevent it from coming back. How to cope with lifestyle changes Coping with stress Stress is your body's reaction to life changes and events, both good and bad. Stressful situations may include:  Getting married.  The death of a spouse.  Losing a job.  Retiring.  Having a baby.  Stress can last just a few hours or it can be ongoing. Stress can play a major role in depression, so it is important to learn both how to cope with stress and how to think about it differently. Talk with your health care provider or a counselor if you would like to learn more about stress reduction. He or she may suggest some stress  reduction techniques, such as:  Music therapy. This can include creating music or listening to music. Choose music that you enjoy and that inspires you.  Mindfulness-based meditation. This kind of meditation can be done while sitting or walking. It involves being aware of your normal breaths, rather than trying to control your breathing.  Centering prayer. This is a kind of meditation that involves focusing on a spiritual word or phrase. Choose a word, phrase, or sacred image that is meaningful to you and that brings you peace.  Deep breathing. To do this, expand your stomach and inhale slowly through your nose. Hold your breath for 3-5 seconds, then exhale slowly, allowing your stomach muscles to relax.  Muscle relaxation. This involves intentionally tensing muscles then relaxing them.  Choose a stress reduction technique that fits your lifestyle and personality. Stress reduction techniques take time and practice to develop. Set aside 5-15 minutes a day to do them. Therapists can offer training in these techniques. The training may be covered by some insurance plans. Other things you can do to manage stress include:  Keeping a stress diary. This can help you learn what triggers your stress and ways to control your response.  Understanding what your limits are and saying no to requests or events that lead to a schedule that is too full.  Thinking about how you respond to certain situations. You may not be able to control everything, but you can control how you react.  Adding humor to your life by watching funny films or TV shows.  Making time for activities that help you relax and not feeling guilty about spending your time this   way.  Medicines Your health care provider may suggest certain medicines if he or she feels that they will help improve your condition. Avoid using alcohol and other substances that may prevent your medicines from working properly (may interact). It is also important  to:  Talk with your pharmacist or health care provider about all the medicines that you take, their possible side effects, and what medicines are safe to take together.  Make it your goal to take part in all treatment decisions (shared decision-making). This includes giving input on the side effects of medicines. It is best if shared decision-making with your health care provider is part of your total treatment plan.  If your health care provider prescribes a medicine, you may not notice the full benefits of it for 4-8 weeks. Most people who are treated for depression need to be on medicine for at least 6-12 months after they feel better. If you are taking medicines as part of your treatment, do not stop taking medicines without first talking to your health care provider. You may need to have the medicine slowly decreased (tapered) over time to decrease the risk of harmful side effects. Relationships Your health care provider may suggest family therapy along with individual therapy and drug therapy. While there may not be family problems that are causing you to feel depressed, it is still important to make sure your family learns as much as they can about your mental health. Having your family's support can help make your treatment successful. How to recognize changes in your condition Everyone has a different response to treatment for depression. Recovery from major depression happens when you have not had signs of major depression for two months. This may mean that you will start to:  Have more interest in doing activities.  Feel less hopeless than you did 2 months ago.  Have more energy.  Overeat less often, or have better or improving appetite.  Have better concentration.  Your health care provider will work with you to decide the next steps in your recovery. It is also important to recognize when your condition is getting worse. Watch for these signs:  Having fatigue or low  energy.  Eating too much or too little.  Sleeping too much or too little.  Feeling restless, agitated, or hopeless.  Having trouble concentrating or making decisions.  Having unexplained physical complaints.  Feeling irritable, angry, or aggressive.  Get help as soon as you or your family members notice these symptoms coming back. How to get support and help from others How to talk with friends and family members about your condition Talking to friends and family members about your condition can provide you with one way to get support and guidance. Reach out to trusted friends or family members, explain your symptoms to them, and let them know that you are working with a health care provider to treat your depression. Financial resources Not all insurance plans cover mental health care, so it is important to check with your insurance carrier. If paying for co-pays or counseling services is a problem, search for a local or county mental health care center. They may be able to offer public mental health care services at low or no cost when you are not able to see a private health care provider. If you are taking medicine for depression, you may be able to get the generic form, which may be less expensive. Some makers of prescription medicines also offer help to patients who cannot afford   the medicines they need. Follow these instructions at home:  Get the right amount and quality of sleep.  Cut down on using caffeine, tobacco, alcohol, and other potentially harmful substances.  Try to exercise, such as walking or lifting small weights.  Take over-the-counter and prescription medicines only as told by your health care provider.  Eat a healthy diet that includes plenty of vegetables, fruits, whole grains, low-fat dairy products, and lean protein. Do not eat a lot of foods that are high in solid fats, added sugars, or salt.  Keep all follow-up visits as told by your health care provider.  This is important. Contact a health care provider if:  You stop taking your antidepressant medicines, and you have any of these symptoms: ? Nausea. ? Headache. ? Feeling lightheaded. ? Chills and body aches. ? Not being able to sleep (insomnia).  You or your friends and family think your depression is getting worse. Get help right away if:  You have thoughts of hurting yourself or others. If you ever feel like you may hurt yourself or others, or have thoughts about taking your own life, get help right away. You can go to your nearest emergency department or call:  Your local emergency services (911 in the [place]  A suicide crisis helpline, such as the National Suicide Prevention Lifeline at 1-800-273-8255. This is open 24-hours a day.  Summary  If you are living with depression, there are ways to help you recover from it and also ways to prevent it from coming back.  Work with your health care team to create a management plan that includes counseling, stress management techniques, and healthy lifestyle habits. This information is not intended to replace advice given to you by your health care provider. Make sure you discuss any questions you have with your health care provider. Document Released: 10/25/2016 Document Revised: 10/25/2016 Document Reviewed: 10/25/2016 Elsevier Interactive Patient Education  2018 Elsevier Inc.  

## 2018-02-22 NOTE — Progress Notes (Signed)
Subjective:  Patient ID: [name], female    DOB: 06/09/1981  Age: 37 y.o. MRN: [id number]  CC: HTN and PAP   HPI  [name] is a 37 y.o. female with a medical history of HTN and LBP w/sciatica presents for f/u of HTN and for pap smear collection. BP 135/84 mmHg nearly one month ago. Had amlodipine increased to 5 mg and HCTZ decreased to 12.5 mg tablet for convenience. (had already been taking half tablet of 25 mg dose). Currently taking amlodipine 5 mg and HCTZ 12.5 mg at night. Waking up to urinate frequently at night. BP 153/89 mmHg today. PHQ 9 and GAD7 scores are 14 each. Feels stressed, not working at the moment, not happy with her weight, does not sleep well regardless of HCTZ at night. Denies suicidal ideation/intent. Does not endorse any other symptoms or complaints.    Outpatient Medications Prior to Visit  Medication Sig Dispense Refill  . acetaminophen (TYLENOL) 500 MG tablet Take 1,000 mg by mouth every 6 (six) hours as needed for pain.     Marland Kitchen amLODipine (NORVASC) 5 MG tablet Take 1 tablet (5 mg total) by mouth daily. 90 tablet 3  . hydrochlorothiazide (HYDRODIURIL) 12.5 MG tablet Take 1 tablet (12.5 mg total) by mouth daily. 90 tablet 3  . docusate sodium (COLACE) 50 MG capsule Take 1 capsule (50 mg total) by mouth daily. (Patient not taking: Reported on 02/22/2018) 30 capsule 0  . ferrous sulfate 325 (65 FE) MG EC tablet Take 1 tablet (325 mg total) by mouth 3 (three) times daily with meals. (Patient not taking: Reported on 02/22/2018) 90 tablet 0   No facility-administered medications prior to visit.      ROS Review of Systems  Constitutional: Negative for chills, fever and malaise/fatigue.  Eyes: Negative for blurred vision.  Respiratory: Negative for shortness of breath.   Cardiovascular: Negative for chest pain and palpitations.  Gastrointestinal: Negative for abdominal pain and nausea.  Genitourinary: Negative for dysuria and hematuria.   Musculoskeletal: Negative for joint pain and myalgias.  Skin: Negative for rash.  Neurological: Negative for tingling and headaches.  Psychiatric/Behavioral: Positive for depression. The patient is nervous/anxious and has insomnia.     Objective:  Wt (!) 306 lb 6.4 oz (139 kg)   LMP 02/14/2018 (Approximate)   BMI 45.25 kg/m   Vitals:   02/22/18 0937  BP: (!) 153/89  Pulse: 71  Temp: 98 F (36.7 C)  SpO2: 97%     Physical Exam  Constitutional: She is oriented to person, place, and time.  Well developed, obese, NAD, polite  HENT:  Head: Normocephalic and atraumatic.  Eyes: Conjunctivae are normal. No scleral icterus.  Cardiovascular: Normal rate, regular rhythm and normal heart sounds.  Pulmonary/Chest: Effort normal and breath sounds normal.  Genitourinary:  Genitourinary Comments: No external genital lesions. No vaginal discharge, cervix unremarkable with no motion tenderness, no adnexal mass/tenderness bilaterally, no uterine mass/tenderness.  Neurological: She is alert and oriented to person, place, and time.  Psychiatric: She has a normal mood and affect. Her behavior is normal. Thought content normal.  Vitals reviewed.    Assessment & Plan:    1. Hypertension, unspecified type - Continue to take Amlodipine in the evening. Take HCTZ in the morning to avoid micturation in early morning hours which is contributing to her sleep disturbance.   2. Depression with anxiety - escitalopram (LEXAPRO) 10 MG tablet; Take 1 tablet (10 mg total) by mouth daily.  Dispense: 30  tablet; Refill: 1 - clonazePAM (KLONOPIN) 1 MG tablet; Take 1 tablet (1 mg total) by mouth daily.  Dispense: 30 tablet; Refill: 0 - I have sent message to Ms. Emily LucksJasmine Lewis LCSW to reach out to patient. - I have printed community resources [facility]eval[facility].  3. Screening for cervical cancer - Cytology - PAP([place])   Meds ordered this encounter   Medications  . escitalopram (LEXAPRO) 10 MG tablet    Sig: Take 1 tablet (10 mg total) by mouth daily.    Dispense:  30 tablet    Refill:  1    Order Specific Question:   Supervising Provider    Answer:   Quentin AngstJEGEDE, OLUGBEMIGA E [id number]]  . clonazePAM (KLONOPIN) 1 MG tablet    Sig: Take 1 tablet (1 mg total) by mouth daily.    Dispense:  30 tablet    Refill:  0    Order Specific Question:   Supervising Provider    Answer:   Quentin AngstJEGEDE, OLUGBEMIGA E [id number]    Follow-up: Return in about 6 weeks (around 04/05/2018). For depression.  Loletta Specteroger David Justis Dupas PA

## 2018-02-24 ENCOUNTER — Other Ambulatory Visit (INDEPENDENT_AMBULATORY_CARE_PROVIDER_SITE_OTHER): Payer: Self-pay | Admitting: Physician Assistant

## 2018-02-24 ENCOUNTER — Telehealth: Payer: Self-pay | Admitting: Licensed Clinical Social Worker

## 2018-02-24 DIAGNOSIS — B9689 Other specified bacterial agents as the cause of diseases classified elsewhere: Secondary | ICD-10-CM

## 2018-02-24 DIAGNOSIS — N760 Acute vaginitis: Principal | ICD-10-CM

## 2018-02-24 LAB — CYTOLOGY - PAP
BACTERIAL VAGINITIS: POSITIVE — AB
CANDIDA VAGINITIS: NEGATIVE
CHLAMYDIA, DNA PROBE: NEGATIVE
Diagnosis: NEGATIVE
NEISSERIA GONORRHEA: NEGATIVE
TRICH (WINDOWPATH): NEGATIVE

## 2018-02-24 MED ORDER — METRONIDAZOLE 500 MG PO TABS: 500.0000 mg | ORAL_TABLET | Freq: Two times a day (BID) | ORAL | 0 refills | Status: DC

## 2018-02-24 NOTE — Telephone Encounter (Signed)
LCSWA attempted to contact pt to follow up on behavioral health screens from prior appointment. LCSWA left message for a return call.  

## 2018-02-27 ENCOUNTER — Other Ambulatory Visit (INDEPENDENT_AMBULATORY_CARE_PROVIDER_SITE_OTHER): Payer: Self-pay | Admitting: Physician Assistant

## 2018-02-27 ENCOUNTER — Telehealth (INDEPENDENT_AMBULATORY_CARE_PROVIDER_SITE_OTHER): Payer: Self-pay

## 2018-02-27 DIAGNOSIS — B9689 Other specified bacterial agents as the cause of diseases classified elsewhere: Secondary | ICD-10-CM

## 2018-02-27 DIAGNOSIS — N760 Acute vaginitis: Principal | ICD-10-CM

## 2018-02-27 MED ORDER — METRONIDAZOLE 500 MG PO TABS: 500.0000 mg | ORAL_TABLET | Freq: Two times a day (BID) | ORAL | 0 refills | Status: AC

## 2018-02-27 NOTE — Telephone Encounter (Signed)
-----   Message from Loletta Specteroger David Gomez, PA-C sent at 02/24/2018  7:29 PM EDT ----- Negative for malignancy. Positive for BV. I have sent metronidazole to [facility] [address]

## 2018-02-27 NOTE — Telephone Encounter (Signed)
Please let her know I resent to [facility].

## 2018-02-27 NOTE — Telephone Encounter (Signed)
Patient aware of results and medication, she is requesting the medication be sent to [facility]. Maryjean Mornempestt S Roberts, CMA

## 2018-02-27 NOTE — Telephone Encounter (Signed)
Left patient a message that medication was transferred to [facility]. Maryjean Mornempestt S Roberts, CMA

## 2018-02-28 ENCOUNTER — Telehealth: Payer: Self-pay | Admitting: Licensed Clinical Social Worker

## 2018-02-28 NOTE — Telephone Encounter (Signed)
LCSWA introduced self and [facility]. LCSWA disclosed that she received a consult from PA-C Lily KocherGomez to provide behavioral health resources.   Pt agreed to schedule appointment with LCSWA on Monday, April 1, 19. No additional concerns noted.

## 2018-03-06 ENCOUNTER — Ambulatory Visit: Payer: Medicaid Other | Attending: Physician Assistant | Admitting: Licensed Clinical Social Worker

## 2018-03-06 DIAGNOSIS — F331 Major depressive disorder, recurrent, moderate: Secondary | ICD-10-CM

## 2018-03-06 DIAGNOSIS — F419 Anxiety disorder, unspecified: Secondary | ICD-10-CM

## 2018-03-06 NOTE — BH Specialist Note (Signed)
Integrated Behavioral Health Initial Visit  MRN: [id number] Name: [name]  Number of Integrated Behavioral Health Clinician visits:: 1/6 Session Start time: 10:00 AM  Session End time: 10:30 AM Total time: 30 minutes  Type of Service: Integrated Behavioral Health- Individual/Family Interpretor:No. Interpretor Name and Language: N/A   Warm Hand Off Completed.       SUBJECTIVE: [name] is a 37 y.o. female accompanied by self Patient was referred by Conley SimmondsPA-C Gomez for depression and anxiety. Patient reports the following symptoms/concerns: feelings of sadness and worry, difficulty sleeping, low motivation, weight gain, feeling bad about self, decreased concentration, and irritability Duration of problem: 1 year; Severity of problem: moderate  OBJECTIVE: Mood: Depressed and Affect: Tearful Risk of harm to self or others: No plan to harm self or others  LIFE CONTEXT: Family and Social: Pt and minor son resides with pt's mother.  School/Work: Pt is currently unemployed and does not receive public benefits Self-Care: Pt enjoys working out to cope with stressors. She denies substance use Life Changes: Pt recently ended a long term relationship with her son's father. [place] and son is having issues adjusting to the move. Pt is experiencing financial strain and has ongoing medical conditions.  GOALS ADDRESSED: Patient will: 1. Reduce symptoms of: anxiety and depression 2. Increase knowledge and/or ability of: coping skills and healthy habits  3. Demonstrate ability to: Increase healthy adjustment to current life circumstances and Increase adequate support systems for patient/family  INTERVENTIONS: Interventions utilized: Mindfulness or Management consultantelaxation Training, Supportive Counseling, Psychoeducation and/or Health Education and Link to [facility] Resources  Standardized Assessments completed: GAD-7 and PHQ  2&9  ASSESSMENT: Patient currently experiencing depression and anxiety. Pt recently ended a long term relationship with her son's father. [place] and son is having issues adjusting to the move. Pt is experiencing financial strain and has ongoing medical conditions. She receives support from family. No report of SI/HI/AVH or substance use.   Patient may benefit from psychoeducation and psychotherapy. LCSWA educated pt on correlation between one's physical and mental health. Therapeutic interventions to promote positive feelings and manage stress was discussed. Pt is participating in medication management and is open to psychotherapy. LCSWA provided pt with community resources for employment, food insecurity, psychotherapy, crisis intervention, [employer] scholarship, and financial counseling.   PLAN: 1. Follow up with behavioral health clinician on : Pt was encouraged to contact LCSWA if symptoms worsen or fail to improve to schedule [facility]. 2. Behavioral recommendations: LCSWA recommends that pt apply healthy coping skills discussed, comply with medication management, and utilize provided resources. Pt is encouraged to schedule follow up appointment with LCSWA 3. Referral(s): ParamedicCommunity Mental Health Services (LME/Outside Clinic) and Community Resources:  Academic librarianood and Finances 4. "From scale of 1-10, how likely are you to follow plan?": 7/10  Bridgett LarssonJasmine D Quintyn Dombek, LCSW 03/08/18 4:52 PM

## 2018-03-15 ENCOUNTER — Ambulatory Visit: Payer: Medicaid Other

## 2018-04-05 ENCOUNTER — Ambulatory Visit (INDEPENDENT_AMBULATORY_CARE_PROVIDER_SITE_OTHER): Payer: Self-pay | Admitting: Physician Assistant

## 2018-04-05 ENCOUNTER — Encounter (INDEPENDENT_AMBULATORY_CARE_PROVIDER_SITE_OTHER): Payer: Self-pay | Admitting: Physician Assistant

## 2018-04-05 ENCOUNTER — Other Ambulatory Visit: Payer: Self-pay

## 2018-04-05 VITALS — BP 169/101 | HR 59 | Temp 98.2°F | Wt 302.6 lb

## 2018-04-05 DIAGNOSIS — R112 Nausea with vomiting, unspecified: Secondary | ICD-10-CM

## 2018-04-05 DIAGNOSIS — F418 Other specified anxiety disorders: Secondary | ICD-10-CM

## 2018-04-05 DIAGNOSIS — I10 Essential (primary) hypertension: Secondary | ICD-10-CM

## 2018-04-05 MED ORDER — ESCITALOPRAM OXALATE 20 MG PO TABS: 20.0000 mg | ORAL_TABLET | Freq: Every day | ORAL | 3 refills | Status: AC

## 2018-04-05 MED ORDER — METOCLOPRAMIDE HCL 5 MG PO TABS: 5.0000 mg | ORAL_TABLET | Freq: Two times a day (BID) | ORAL | 3 refills | Status: DC | PRN

## 2018-04-05 MED ORDER — CLONAZEPAM 0.5 MG PO TABS: 0.5000 mg | ORAL_TABLET | Freq: Every day | ORAL | 0 refills | Status: DC

## 2018-04-05 NOTE — Patient Instructions (Signed)
Living With Depression Everyone experiences occasional disappointment, sadness, and loss in their lives. When you are feeling down, blue, or sad for at least 2 weeks in a row, it may mean that you have depression. Depression can affect your thoughts and feelings, relationships, daily activities, and physical health. It is caused by changes in the way your brain functions. If you receive a diagnosis of depression, your health care provider will tell you which type of depression you have and what treatment options are available to you. If you are living with depression, there are ways to help you recover from it and also ways to prevent it from coming back. How to cope with lifestyle changes Coping with stress Stress is your body's reaction to life changes and events, both good and bad. Stressful situations may include:  Getting married.  The death of a spouse.  Losing a job.  Retiring.  Having a baby.  Stress can last just a few hours or it can be ongoing. Stress can play a major role in depression, so it is important to learn both how to cope with stress and how to think about it differently. Talk with your health care provider or a counselor if you would like to learn more about stress reduction. He or she may suggest some stress reduction techniques, such as:  Music therapy. This can include creating music or listening to music. Choose music that you enjoy and that inspires you.  Mindfulness-based meditation. This kind of meditation can be done while sitting or walking. It involves being aware of your normal breaths, rather than trying to control your breathing.  Centering prayer. This is a kind of meditation that involves focusing on a spiritual word or phrase. Choose a word, phrase, or sacred image that is meaningful to you and that brings you peace.  Deep breathing. To do this, expand your stomach and inhale slowly through your nose. Hold your breath for 3-5 seconds, then exhale  slowly, allowing your stomach muscles to relax.  Muscle relaxation. This involves intentionally tensing muscles then relaxing them.  Choose a stress reduction technique that fits your lifestyle and personality. Stress reduction techniques take time and practice to develop. Set aside 5-15 minutes a day to do them. Therapists can offer training in these techniques. The training may be covered by some insurance plans. Other things you can do to manage stress include:  Keeping a stress diary. This can help you learn what triggers your stress and ways to control your response.  Understanding what your limits are and saying no to requests or events that lead to a schedule that is too full.  Thinking about how you respond to certain situations. You may not be able to control everything, but you can control how you react.  Adding humor to your life by watching funny films or TV shows.  Making time for activities that help you relax and not feeling guilty about spending your time this way.  Medicines Your health care provider may suggest certain medicines if he or she feels that they will help improve your condition. Avoid using alcohol and other substances that may prevent your medicines from working properly (may interact). It is also important to:  Talk with your pharmacist or health care provider about all the medicines that you take, their possible side effects, and what medicines are safe to take together.  Make it your goal to take part in all treatment decisions (shared decision-making). This includes giving input on the side   effects of medicines. It is best if shared decision-making with your health care provider is part of your total treatment plan.  If your health care provider prescribes a medicine, you may not notice the full benefits of it for 4-8 weeks. Most people who are treated for depression need to be on medicine for at least 6-12 months after they feel better. If you are taking  medicines as part of your treatment, do not stop taking medicines without first talking to your health care provider. You may need to have the medicine slowly decreased (tapered) over time to decrease the risk of harmful side effects. Relationships Your health care provider may suggest family therapy along with individual therapy and drug therapy. While there may not be family problems that are causing you to feel depressed, it is still important to make sure your family learns as much as they can about your mental health. Having your family's support can help make your treatment successful. How to recognize changes in your condition Everyone has a different response to treatment for depression. Recovery from major depression happens when you have not had signs of major depression for two months. This may mean that you will start to:  Have more interest in doing activities.  Feel less hopeless than you did 2 months ago.  Have more energy.  Overeat less often, or have better or improving appetite.  Have better concentration.  Your health care provider will work with you to decide the next steps in your recovery. It is also important to recognize when your condition is getting worse. Watch for these signs:  Having fatigue or low energy.  Eating too much or too little.  Sleeping too much or too little.  Feeling restless, agitated, or hopeless.  Having trouble concentrating or making decisions.  Having unexplained physical complaints.  Feeling irritable, angry, or aggressive.  Get help as soon as you or your family members notice these symptoms coming back. How to get support and help from others How to talk with friends and family members about your condition Talking to friends and family members about your condition can provide you with one way to get support and guidance. Reach out to trusted friends or family members, explain your symptoms to them, and let them know that you are  working with a health care provider to treat your depression. Financial resources Not all insurance plans cover mental health care, so it is important to check with your insurance carrier. If paying for co-pays or counseling services is a problem, search for a local or county mental health care center. They may be able to offer public mental health care services at low or no cost when you are not able to see a private health care provider. If you are taking medicine for depression, you may be able to get the generic form, which may be less expensive. Some makers of prescription medicines also offer help to patients who cannot afford the medicines they need. Follow these instructions at home:  Get the right amount and quality of sleep.  Cut down on using caffeine, tobacco, alcohol, and other potentially harmful substances.  Try to exercise, such as walking or lifting small weights.  Take over-the-counter and prescription medicines only as told by your health care provider.  Eat a healthy diet that includes plenty of vegetables, fruits, whole grains, low-fat dairy products, and lean protein. Do not eat a lot of foods that are high in solid fats, added sugars, or salt.    Keep all follow-up visits as told by your health care provider. This is important. Contact a health care provider if:  You stop taking your antidepressant medicines, and you have any of these symptoms: ? Nausea. ? Headache. ? Feeling lightheaded. ? Chills and body aches. ? Not being able to sleep (insomnia).  You or your friends and family think your depression is getting worse. Get help right away if:  You have thoughts of hurting yourself or others. If you ever feel like you may hurt yourself or others, or have thoughts about taking your own life, get help right away. You can go to your nearest emergency department or call:  Your local emergency services (911 in the [place]  A suicide crisis helpline, such as the  National Suicide Prevention Lifeline at 1-800-273-8255. This is open 24-hours a day.  Summary  If you are living with depression, there are ways to help you recover from it and also ways to prevent it from coming back.  Work with your health care team to create a management plan that includes counseling, stress management techniques, and healthy lifestyle habits. This information is not intended to replace advice given to you by your health care provider. Make sure you discuss any questions you have with your health care provider. Document Released: 10/25/2016 Document Revised: 10/25/2016 Document Reviewed: 10/25/2016 Elsevier Interactive Patient Education  2018 [employer]  

## 2018-04-05 NOTE — Progress Notes (Signed)
Subjective:  Patient ID: [name], female    DOB: 1981-03-06  Age: 37 y.o. MRN: [id number]  CC: f/u depression  HPI Emily L Humpheryis a 37 y.o.femalewith a medical history of anxiety, depression, HTN and LBP w/sciatica presents for f/u of HTN and depression/anxiety. PHQ 9 and GAD7 scores both 14 last month. PHQ9 3 and GAD7 0 today. Feels much better. Has improved focus, not crying, better sleep, feels confident, not anxious, able to go out, and does not cry without reason.     Last BP 153/89 mmHg approximately one and a half months ago. Has been out of medication for one week and says she is expecting her HCTZ to arrive in the mail soon. No CP, palpitations, SOB, HA, abdominal pain, f/c/n/v, rash, presyncope, syncope, or GI/GU sxs.     Outpatient Medications Prior to Visit  Medication Sig Dispense Refill  . acetaminophen (TYLENOL) 500 MG tablet Take 1,000 mg by mouth every 6 (six) hours as needed for pain.     Marland Kitchen amLODipine (NORVASC) 5 MG tablet Take 1 tablet (5 mg total) by mouth daily. 90 tablet 3  . clonazePAM (KLONOPIN) 1 MG tablet Take 1 tablet (1 mg total) by mouth daily. 30 tablet 0  . escitalopram (LEXAPRO) 10 MG tablet Take 1 tablet (10 mg total) by mouth daily. 30 tablet 1  . hydrochlorothiazide (HYDRODIURIL) 12.5 MG tablet Take 1 tablet (12.5 mg total) by mouth daily. 90 tablet 3  . docusate sodium (COLACE) 50 MG capsule Take 1 capsule (50 mg total) by mouth daily. (Patient not taking: Reported on 02/22/2018) 30 capsule 0  . ferrous sulfate 325 (65 FE) MG EC tablet Take 1 tablet (325 mg total) by mouth 3 (three) times daily with meals. (Patient not taking: Reported on 02/22/2018) 90 tablet 0   No facility-administered medications prior to visit.      ROS Review of Systems  Constitutional: Negative for chills, fever and malaise/fatigue.  Eyes: Negative for blurred vision.  Respiratory: Negative for shortness of breath.   Cardiovascular: Negative for chest pain  and palpitations.  Gastrointestinal: Negative for abdominal pain and nausea.  Genitourinary: Negative for dysuria and hematuria.  Musculoskeletal: Negative for joint pain and myalgias.  Skin: Negative for rash.  Neurological: Negative for tingling and headaches.  Psychiatric/Behavioral: Negative for depression. The patient is not nervous/anxious.     Objective:  BP (!) 169/101 (BP Location: Left Arm, Patient Position: Sitting, Cuff Size: Large)   Pulse (!) 59   Temp 98.2 F (36.8 C) (Oral)   Wt (!) 302 lb 9.6 oz (137.3 kg)   LMP 03/22/2018 (Exact Date)   SpO2 94%   BMI 44.69 kg/m   BP/Weight 04/05/2018 02/22/2018 01/31/2018  Systolic BP 169 153 135  Diastolic BP 101 89 84  Wt. (Lbs) 302.6 306.4 303  BMI 44.69 45.25 44.75      Physical Exam  Constitutional: She is oriented to person, place, and time.  Well developed, well nourished, NAD, polite  HENT:  Head: Normocephalic and atraumatic.  Eyes: No scleral icterus.  Neck: Normal range of motion. Neck supple. No thyromegaly present.  Cardiovascular: Normal rate, regular rhythm and normal heart sounds.  Pulmonary/Chest: Effort normal and breath sounds normal.  Abdominal: Soft. Bowel sounds are normal. There is no tenderness.  Musculoskeletal: She exhibits no edema.  Neurological: She is alert and oriented to person, place, and time.  Skin: Skin is warm and dry. No rash noted. No erythema. No pallor.  Psychiatric: She  has a normal mood and affect. Her behavior is normal. Thought content normal.  Vitals reviewed.    Assessment & Plan:   1. Essential hypertension - Uncontrolled today. Has not taken medications in one week. Awaiting medications in the mail. Reassess at next appointment.   2. Depression with anxiety - Increase escitalopram (LEXAPRO) 20 MG tablet; Take 1 tablet (20 mg total) by mouth daily.  Dispense: 30 tablet; Refill: 3 - Decrease clonazePAM (KLONOPIN) 0.5 MG tablet; Take 1 tablet (0.5 mg total) by mouth at  bedtime.  Dispense: 30 tablet; Refill: 0  3. Non-intractable vomiting with nausea, unspecified vomiting type - Begin metoCLOPramide (REGLAN) 5 MG tablet; Take 1 tablet (5 mg total) by mouth 2 (two) times daily as needed for nausea.  Dispense: 60 tablet; Refill: 3   Meds ordered this encounter  Medications  . escitalopram (LEXAPRO) 20 MG tablet    Sig: Take 1 tablet (20 mg total) by mouth daily.    Dispense:  30 tablet    Refill:  3    Order Specific Question:   Supervising Provider    Answer:   Quentin Angst [id number]  . clonazePAM (KLONOPIN) 0.5 MG tablet    Sig: Take 1 tablet (0.5 mg total) by mouth at bedtime.    Dispense:  30 tablet    Refill:  0    Order Specific Question:   Supervising Provider    Answer:   Quentin Angst [id number]  . metoCLOPramide (REGLAN) 5 MG tablet    Sig: Take 1 tablet (5 mg total) by mouth 2 (two) times daily as needed for nausea.    Dispense:  60 tablet    Refill:  3    Order Specific Question:   Supervising Provider    Answer:   Quentin Angst [[id number]]    Follow-up: Return in about 1 month (around 05/03/2018) for depression with anxiety.   Loletta Specter PA

## 2018-05-09 ENCOUNTER — Ambulatory Visit (INDEPENDENT_AMBULATORY_CARE_PROVIDER_SITE_OTHER): Payer: Medicaid Other | Admitting: Physician Assistant

## 2018-06-05 ENCOUNTER — Ambulatory Visit (INDEPENDENT_AMBULATORY_CARE_PROVIDER_SITE_OTHER): Payer: Medicaid Other | Admitting: Physician Assistant

## 2019-12-24 ENCOUNTER — Telehealth (INDEPENDENT_AMBULATORY_CARE_PROVIDER_SITE_OTHER): Payer: Medicaid Other | Admitting: Primary Care

## 2019-12-25 ENCOUNTER — Telehealth (INDEPENDENT_AMBULATORY_CARE_PROVIDER_SITE_OTHER): Payer: Medicaid Other | Admitting: Primary Care

## 2019-12-25 DIAGNOSIS — I10 Essential (primary) hypertension: Secondary | ICD-10-CM

## 2019-12-25 DIAGNOSIS — R5383 Other fatigue: Secondary | ICD-10-CM

## 2019-12-25 DIAGNOSIS — R519 Headache, unspecified: Secondary | ICD-10-CM

## 2019-12-25 NOTE — Progress Notes (Signed)
Virtual Visit via Telephone Note  I connected with [name] on 12/25/19 at 10:30 AM EST by telephone and verified that I am speaking with the correct person using two identifiers.   I discussed the limitations, risks, security and privacy concerns of performing an evaluation and management service by telephone and the availability of in person appointments. I also discussed with the patient that there may be a patient responsible charge related to this service. The patient expressed understanding and agreed to proceed.   History of Present Illness: [name] is establishing care with new provider but established with [facility]. She is complaining of a headache every day taking BC powder everyday with little relief. States this has happen before headaches with elevated Bp. 170/110 Denies shortness of breath,  chest pain or lower extremity edema  Past Medical History:  Diagnosis Date  . Hypertension      Current Outpatient Medications on File Prior to Visit  Medication Sig Dispense Refill  . acetaminophen (TYLENOL) 500 MG tablet Take 1,000 mg by mouth every 6 (six) hours as needed for pain.     Marland Kitchen amLODipine (NORVASC) 5 MG tablet Take 1 tablet (5 mg total) by mouth daily. 90 tablet 3  . clonazePAM (KLONOPIN) 0.5 MG tablet Take 1 tablet (0.5 mg total) by mouth at bedtime. 30 tablet 0  . escitalopram (LEXAPRO) 20 MG tablet Take 1 tablet (20 mg total) by mouth daily. 30 tablet 3  . hydrochlorothiazide (HYDRODIURIL) 12.5 MG tablet Take 1 tablet (12.5 mg total) by mouth daily. 90 tablet 3  . metoCLOPramide (REGLAN) 5 MG tablet Take 1 tablet (5 mg total) by mouth 2 (two) times daily as needed for nausea. 60 tablet 3   No current facility-administered medications on file prior to visit.   Observations/Objective: Review of Systems  Constitutional: Positive for malaise/fatigue.  Neurological: Positive for dizziness and headaches.  All other systems reviewed and are  negative.   Assessment and Plan: Diagnoses and all orders for this visit:  Diagnoses and all orders for this visit:  Essential hypertension Counseled on blood pressure goal of less than 130/80, low-sodium, DASH diet, medication compliance, 150 minutes of moderate intensity exercise per week. Discussed medication compliance, adverse effects.  Fatigue, unspecified type Has a history anemia needs labs rule out anemia and thyroid disease  Nonintractable headache, unspecified chronicity pattern, unspecified headache type Discussed ways to management headache :  Cool Compress. Lie down and place a cool compress on your head.   Avoid headache triggers. If certain foods or odors seem to have triggered your migraines in the past, avoid them. A headache diary might help you identify triggers.   Include physical activity in your daily routine. Try a daily walk or other moderate aerobic exercise.   Manage stress. Find healthy ways to cope with the stressors, such as delegating tasks on your to-do list.   Practice relaxation techniques. Try deep breathing, yoga, massage and visualization.   Eat regularly. Eating regularly scheduled meals and maintaining a healthy diet might help prevent headaches. Also, drink plenty of fluids.   Follow a regular sleep schedule. Sleep deprivation might contribute to headaches  Consider biofeedback. With this mind-body technique, you learn to control certain bodily functions -- such as muscle tension, heart rate and blood pressure -- to prevent headaches or reduce headache pain.      Follow Up Instructions:    I discussed the assessment and treatment plan with the patient. The patient was provided an opportunity  to ask questions and all were answered. The patient agreed with the plan and demonstrated an understanding of the instructions.   The patient was advised to call back or seek an in-person evaluation if the symptoms worsen or if the condition fails  to improve as anticipated.  I provided 22 minutes of non-face-to-face time during this encounter.   Kerin Perna, NP

## 2019-12-27 ENCOUNTER — Encounter (INDEPENDENT_AMBULATORY_CARE_PROVIDER_SITE_OTHER): Payer: Self-pay | Admitting: Primary Care

## 2019-12-27 MED ORDER — HYDROCHLOROTHIAZIDE 25 MG PO TABS: ORAL_TABLET | ORAL | 1 refills | Status: AC

## 2019-12-27 MED ORDER — AMLODIPINE BESYLATE 10 MG PO TABS: 5.0000 mg | ORAL_TABLET | Freq: Every day | ORAL | 1 refills | Status: AC

## 2019-12-27 MED FILL — AMLODIPINE BESYLATE 10 MG T: 10 | 30 days supply | Qty: 15 | Fill #0

## 2019-12-27 MED FILL — HYDROCHLOROTHIAZIDE 25 MG T: 25 | 30 days supply | Qty: 30 | Fill #0

## 2020-01-15 ENCOUNTER — Telehealth (INDEPENDENT_AMBULATORY_CARE_PROVIDER_SITE_OTHER): Payer: Medicaid Other | Admitting: Primary Care

## 2020-05-09 ENCOUNTER — Ambulatory Visit (INDEPENDENT_AMBULATORY_CARE_PROVIDER_SITE_OTHER): Payer: Medicaid Other | Admitting: Primary Care

## 2023-12-25 ENCOUNTER — Encounter (HOSPITAL_COMMUNITY): Payer: Self-pay | Admitting: *Deleted

## 2023-12-25 ENCOUNTER — Emergency Department (HOSPITAL_COMMUNITY)
Admission: EM | Admit: 2023-12-25 | Discharge: 2023-12-25 | Disposition: A | Discharge status: Home / Self Care | Payer: Medicaid Other | Attending: Emergency Medicine | Admitting: Emergency Medicine

## 2023-12-25 ENCOUNTER — Emergency Department (HOSPITAL_COMMUNITY): Payer: Medicaid Other

## 2023-12-25 ENCOUNTER — Other Ambulatory Visit: Payer: Self-pay

## 2023-12-25 DIAGNOSIS — R1031 Right lower quadrant pain: Secondary | ICD-10-CM | POA: Diagnosis present

## 2023-12-25 DIAGNOSIS — Z79899 Other long term (current) drug therapy: Secondary | ICD-10-CM | POA: Insufficient documentation

## 2023-12-25 DIAGNOSIS — I10 Essential (primary) hypertension: Secondary | ICD-10-CM | POA: Insufficient documentation

## 2023-12-25 DIAGNOSIS — D259 Leiomyoma of uterus, unspecified: Secondary | ICD-10-CM | POA: Insufficient documentation

## 2023-12-25 DIAGNOSIS — D219 Benign neoplasm of connective and other soft tissue, unspecified: Secondary | ICD-10-CM

## 2023-12-25 HISTORY — DX: Anxiety disorder, unspecified: F41.9

## 2023-12-25 LAB — COMPREHENSIVE METABOLIC PANEL
ALT: 12 U/L (ref 0–44)
AST: 16 U/L (ref 15–41)
Albumin: 4 g/dL (ref 3.5–5.0)
Alkaline Phosphatase: 52 U/L (ref 38–126)
Anion gap: 9 (ref 5–15)
BUN: 10 mg/dL (ref 6–20)
CO2: 22 mmol/L (ref 22–32)
Calcium: 9.4 mg/dL (ref 8.9–10.3)
Chloride: 106 mmol/L (ref 98–111)
Creatinine, Ser: 0.66 mg/dL (ref 0.44–1.00)
GFR, Estimated: >60 mL/min (ref >60)
Glucose, Bld: 94 mg/dL (ref 70–99)
Potassium: 4 mmol/L (ref 3.5–5.1)
Sodium: 137 mmol/L (ref 135–145)
Total Bilirubin: 0.3 mg/dL (ref 0.0–1.2)
Total Protein: 7.5 g/dL (ref 6.5–8.1)

## 2023-12-25 LAB — CBC WITH DIFFERENTIAL/PLATELET
Abs Immature Granulocytes: 0.02 10*3/uL (ref 0.00–0.07)
Basophils Absolute: 0.1 10*3/uL (ref 0.0–0.1)
Basophils Relative: 1 %
Eosinophils Absolute: 0.1 10*3/uL (ref 0.0–0.5)
Eosinophils Relative: 2 %
HCT: 32.7 % — ABNORMAL LOW (ref 36.0–46.0)
Hemoglobin: 9 g/dL — ABNORMAL LOW (ref 12.0–15.0)
Immature Granulocytes: 0 %
Lymphocytes Relative: 34 %
Lymphs Abs: 3.2 10*3/uL (ref 0.7–4.0)
MCH: 20.4 pg — ABNORMAL LOW (ref 26.0–34.0)
MCHC: 27.5 g/dL — ABNORMAL LOW (ref 30.0–36.0)
MCV: 74 fL — ABNORMAL LOW (ref 80.0–100.0)
Monocytes Absolute: 0.6 10*3/uL (ref 0.1–1.0)
Monocytes Relative: 6 %
Neutro Abs: 5.3 10*3/uL (ref 1.7–7.7)
Neutrophils Relative %: 57 %
Platelets: 297 10*3/uL (ref 150–400)
RBC: 4.42 MIL/uL (ref 3.87–5.11)
RDW: 18.8 % — ABNORMAL HIGH (ref 11.5–15.5)
WBC: 9.2 10*3/uL (ref 4.0–10.5)
nRBC: 0 % (ref 0.0–0.2)

## 2023-12-25 LAB — URINALYSIS, ROUTINE W REFLEX MICROSCOPIC
Bacteria, UA: NONE SEEN
Bilirubin Urine: NEGATIVE
Glucose, UA: NEGATIVE mg/dL
Hgb urine dipstick: NEGATIVE
Ketones, ur: NEGATIVE mg/dL
Leukocytes,Ua: NEGATIVE
Nitrite: NEGATIVE
Protein, ur: 30 mg/dL — AB
Specific Gravity, Urine: 1.028 (ref 1.005–1.030)
pH: 7 (ref 5.0–8.0)

## 2023-12-25 LAB — LIPASE, BLOOD: Lipase: 33 U/L (ref 11–51)

## 2023-12-25 LAB — HCG, SERUM, QUALITATIVE: Preg, Serum: NEGATIVE

## 2023-12-25 MED ORDER — IOHEXOL 300 MG/ML  SOLN
100.0000 mL | Freq: Once | INTRAMUSCULAR | Status: AC | PRN
  Administered 2023-12-25: 100 mL via INTRAVENOUS

## 2023-12-25 MED ORDER — HYDROMORPHONE HCL 1 MG/ML IJ SOLN
1.0000 mg | Freq: Once | INTRAMUSCULAR | Status: AC
  Administered 2023-12-25: 1 mg via INTRAVENOUS
  Filled 2023-12-25: qty 1

## 2023-12-25 MED ORDER — OXYCODONE-ACETAMINOPHEN 5-325 MG PO TABS: ORAL_TABLET | ORAL | 0 refills | Status: DC

## 2023-12-25 MED ORDER — OXYCODONE-ACETAMINOPHEN 5-325 MG PO TABS
ORAL_TABLET | ORAL | 0 refills | Status: DC
  Filled 2023-12-25: qty 20, 5d supply, fill #0

## 2023-12-25 MED ORDER — ONDANSETRON HCL 4 MG/2ML IJ SOLN
4.0000 mg | Freq: Once | INTRAMUSCULAR | Status: AC
  Administered 2023-12-25: 4 mg via INTRAVENOUS
  Filled 2023-12-25: qty 2

## 2023-12-25 NOTE — ED Provider Notes (Signed)
[place] EMERGENCY DEPARTMENT AT [facility] Provider Note   CSN: [id number] Arrival date & time: 12/25/23  1152     History {Add pertinent medical, surgical, social history, OB history to HPI:1} Chief Complaint  Patient presents with   Groin Pain    [name] is a 43 y.o. female.  Patient complains of lower abdominal pain for over a month.  She was seen by her family doctor who is post to get an ultrasound.  Patient has a history of hypertension   Groin Pain       Home Medications Prior to Admission medications   Medication Sig Start Date End Date Taking? Authorizing Provider  oxyCODONE-acetaminophen (PERCOCET) 5-325 MG tablet Take 1 every 6 hours for pain has not relieved by Tylenol or Motrin 12/25/23  Yes Bethann Berkshire, MD  acetaminophen (TYLENOL) 500 MG tablet Take 1,000 mg by mouth every 6 (six) hours as needed for pain.     [provider]  amLODipine (NORVASC) 10 MG tablet Take 0.5 tablets (5 mg total) by mouth daily. 12/27/19   Grayce Sessions, NP  clonazePAM (KLONOPIN) 0.5 MG tablet Take 1 tablet (0.5 mg total) by mouth at bedtime. 04/05/18   Loletta Specter, PA-C  escitalopram (LEXAPRO) 20 MG tablet Take 1 tablet (20 mg total) by mouth daily. 04/05/18   Loletta Specter, PA-C  hydrochlorothiazide (HYDRODIURIL) 25 MG tablet Take 1 tablet every morning by mouth 12/27/19   Grayce Sessions, NP  metoCLOPramide (REGLAN) 5 MG tablet Take 1 tablet (5 mg total) by mouth 2 (two) times daily as needed for nausea. 04/05/18   Loletta Specter, PA-C      Allergies    Ibuprofen    Review of Systems   Review of Systems  Physical Exam Updated Vital Signs BP (!) 164/86   Pulse 66   Temp 97.9 F (36.6 C) (Oral)   Resp 16   Ht 5\' 9"  (1.753 m)   Wt 131.5 kg   LMP 12/03/2023 (Exact Date)   SpO2 99%   BMI 42.83 kg/m  Physical Exam  ED Results / Procedures / Treatments   Labs (all labs ordered are listed, but only abnormal results  are displayed) Labs Reviewed  CBC WITH DIFFERENTIAL/PLATELET - Abnormal; Notable for the following components:      Result Value   Hemoglobin 9.0 (*)    HCT 32.7 (*)    MCV 74.0 (*)    MCH 20.4 (*)    MCHC 27.5 (*)    RDW 18.8 (*)    All other components within normal limits  URINALYSIS, ROUTINE W REFLEX MICROSCOPIC - Abnormal; Notable for the following components:   APPearance HAZY (*)    Protein, ur 30 (*)    All other components within normal limits  COMPREHENSIVE METABOLIC PANEL  LIPASE, BLOOD  HCG, SERUM, QUALITATIVE    EKG None  Radiology CT ABDOMEN PELVIS W CONTRAST Result Date: 12/25/2023 CLINICAL DATA:  Right groin/pelvic pain. EXAM: CT ABDOMEN AND PELVIS WITH CONTRAST TECHNIQUE: Multidetector CT imaging of the abdomen and pelvis was performed using the standard protocol following bolus administration of intravenous contrast. RADIATION DOSE REDUCTION: This exam was performed according to the departmental dose-optimization program which includes automated exposure control, adjustment of the mA and/or kV according to patient size and/or use of iterative reconstruction technique. CONTRAST:  OMNIPAQUE IOHEXOL 300 MG/ML  SOLN COMPARISON:  Report from CT abdomen and pelvis dated 01/01/2000. FINDINGS: Lower chest: Scattered bilateral cysts  are seen in both lungs. There is mild left basilar atelectasis. The heart is enlarged. Hepatobiliary: No focal liver abnormality is seen. No gallstones, gallbladder wall thickening, or biliary dilatation. Pancreas: Unremarkable. No pancreatic ductal dilatation or surrounding inflammatory changes. Spleen: Hypoattenuating masses in the spleen measure 2.1 cm and 2.4 cm in size (series 10 image 147 and series 10 image 159). Adrenals/Urinary Tract: Adrenal glands are unremarkable. Kidneys are normal, without renal calculi, focal lesion, or hydronephrosis. Bladder is incompletely distended. Stomach/Bowel: Stomach is within normal limits. Appendix appears  normal. No evidence of bowel wall thickening, distention, or inflammatory changes. Vascular/Lymphatic: Aortic atherosclerosis. No enlarged abdominal or pelvic lymph nodes. Reproductive: Multiple uterine fibroids are noted. No suspicious adnexal mass. Other: No abdominal wall hernia or abnormality. No abdominopelvic ascites. Musculoskeletal: Degenerative changes are seen in the spine. IMPRESSION: 1. Multiple uterine fibroids. 2. Hypoattenuating masses in the spleen are incompletely characterized on this exam, but may reflect hemangiomas. These could be further evaluated with nonemergent MR abdomen. Aortic Atherosclerosis (ICD10-I70.0). Electronically Signed   By: Romona Curls M.D.   On: 12/25/2023 16:37    Procedures Procedures  {Document cardiac monitor, telemetry assessment procedure when appropriate:1}  Medications Ordered in ED Medications  HYDROmorphone (DILAUDID) injection 1 mg (1 mg Intravenous Given 12/25/23 1542)  ondansetron (ZOFRAN) injection 4 mg (4 mg Intravenous Given 12/25/23 1542)  iohexol (OMNIPAQUE) 300 MG/ML solution 100 mL (100 mLs Intravenous Contrast Given 12/25/23 1605)    ED Course/ Medical Decision Making/ A&P   {   Click here for ABCD2, HEART and other calculatorsREFRESH Note before signing :1}                              Medical Decision Making Amount and/or Complexity of Data Reviewed Labs: ordered. Radiology: ordered.  Risk Prescription drug management.   Abdominal pain secondary to uterine fibroids.  Patient is given some Percocet and will follow-up with OB/GYN  {Document critical care time when appropriate:1} {Document review of labs and clinical decision tools ie heart score, Chads2Vasc2 etc:1}  {Document your independent review of radiology images, and any outside records:1} {Document your discussion with family members, caretakers, and with consultants:1} {Document social determinants of health affecting pt's care:1} {Document your decision making  why or why not admission, treatments were needed:1} Final Clinical Impression(s) / ED Diagnoses Final diagnoses:  Fibroids    Rx / DC Orders ED Discharge Orders          Ordered    oxyCODONE-acetaminophen (PERCOCET) 5-325 MG tablet        12/25/23 1745

## 2023-12-25 NOTE — Discharge Instructions (Addendum)
Follow-up with the [facility] in the next few weeks

## 2023-12-25 NOTE — ED Triage Notes (Signed)
Here visiting in [place]. C/o Recurrent continued R groin/pelvic pain. Denies injury. Ongoing since 1/9. Gradually progressively worse. Denies sx other than pain. Waiting to be scheduled for [place] with her OB in [place]. Denies fever, bleeding, NVD, urinary or vaginal sx. No relief with tylenol.

## 2023-12-26 ENCOUNTER — Other Ambulatory Visit: Payer: Self-pay

## 2024-05-28 ENCOUNTER — Encounter (HOSPITAL_COMMUNITY): Payer: Self-pay

## 2024-05-28 ENCOUNTER — Other Ambulatory Visit: Payer: Self-pay

## 2024-05-28 ENCOUNTER — Emergency Department (HOSPITAL_COMMUNITY)
Admission: EM | Admit: 2024-05-28 | Discharge: 2024-05-28 | Disposition: A | Discharge status: Home / Self Care | Attending: Emergency Medicine | Admitting: Emergency Medicine

## 2024-05-28 ENCOUNTER — Emergency Department (HOSPITAL_COMMUNITY)

## 2024-05-28 DIAGNOSIS — R935 Abnormal findings on diagnostic imaging of other abdominal regions, including retroperitoneum: Secondary | ICD-10-CM | POA: Insufficient documentation

## 2024-05-28 DIAGNOSIS — I10 Essential (primary) hypertension: Secondary | ICD-10-CM | POA: Insufficient documentation

## 2024-05-28 DIAGNOSIS — Z79899 Other long term (current) drug therapy: Secondary | ICD-10-CM | POA: Diagnosis not present

## 2024-05-28 DIAGNOSIS — G8918 Other acute postprocedural pain: Secondary | ICD-10-CM | POA: Diagnosis present

## 2024-05-28 LAB — COMPREHENSIVE METABOLIC PANEL WITH GFR
ALT: 17 U/L (ref 0–44)
AST: 22 U/L (ref 15–41)
Albumin: 3.9 g/dL (ref 3.5–5.0)
Alkaline Phosphatase: 45 U/L (ref 38–126)
Anion gap: 13 (ref 5–15)
BUN: 10 mg/dL (ref 6–20)
CO2: 20 mmol/L — ABNORMAL LOW (ref 22–32)
Calcium: 9.2 mg/dL (ref 8.9–10.3)
Chloride: 105 mmol/L (ref 98–111)
Creatinine, Ser: 0.68 mg/dL (ref 0.44–1.00)
GFR, Estimated: >60 mL/min (ref >60)
Glucose, Bld: 96 mg/dL (ref 70–99)
Potassium: 3.8 mmol/L (ref 3.5–5.1)
Sodium: 138 mmol/L (ref 135–145)
Total Bilirubin: 0.6 mg/dL (ref 0.0–1.2)
Total Protein: 7.5 g/dL (ref 6.5–8.1)

## 2024-05-28 LAB — URINALYSIS, ROUTINE W REFLEX MICROSCOPIC
Bilirubin Urine: NEGATIVE
Glucose, UA: NEGATIVE mg/dL
Hgb urine dipstick: NEGATIVE
Ketones, ur: 20 mg/dL — AB
Leukocytes,Ua: NEGATIVE
Nitrite: NEGATIVE
Protein, ur: NEGATIVE mg/dL
Specific Gravity, Urine: 1.023 (ref 1.005–1.030)
pH: 6 (ref 5.0–8.0)

## 2024-05-28 LAB — CBC WITH DIFFERENTIAL/PLATELET
Abs Immature Granulocytes: 0.02 10*3/uL (ref 0.00–0.07)
Basophils Absolute: 0.1 10*3/uL (ref 0.0–0.1)
Basophils Relative: 1 %
Eosinophils Absolute: 0.1 10*3/uL (ref 0.0–0.5)
Eosinophils Relative: 1 %
HCT: 30.5 % — ABNORMAL LOW (ref 36.0–46.0)
Hemoglobin: 8.3 g/dL — ABNORMAL LOW (ref 12.0–15.0)
Immature Granulocytes: 0 %
Lymphocytes Relative: 33 %
Lymphs Abs: 2.7 10*3/uL (ref 0.7–4.0)
MCH: 19.1 pg — ABNORMAL LOW (ref 26.0–34.0)
MCHC: 27.2 g/dL — ABNORMAL LOW (ref 30.0–36.0)
MCV: 70.3 fL — ABNORMAL LOW (ref 80.0–100.0)
Monocytes Absolute: 0.5 10*3/uL (ref 0.1–1.0)
Monocytes Relative: 6 %
Neutro Abs: 4.9 10*3/uL (ref 1.7–7.7)
Neutrophils Relative %: 59 %
Platelets: 382 10*3/uL (ref 150–400)
RBC: 4.34 MIL/uL (ref 3.87–5.11)
RDW: 20.6 % — ABNORMAL HIGH (ref 11.5–15.5)
WBC: 8.3 10*3/uL (ref 4.0–10.5)
nRBC: 0 % (ref 0.0–0.2)

## 2024-05-28 LAB — LIPASE, BLOOD: Lipase: 31 U/L (ref 11–51)

## 2024-05-28 MED ORDER — MORPHINE SULFATE (PF) 4 MG/ML IV SOLN
4.0000 mg | Freq: Once | INTRAVENOUS | Status: AC
  Administered 2024-05-28: 4 mg via INTRAVENOUS
  Filled 2024-05-28: qty 1

## 2024-05-28 MED ORDER — ONDANSETRON 4 MG PO TBDP: 4.0000 mg | ORAL_TABLET | Freq: Three times a day (TID) | ORAL | 0 refills | Status: AC | PRN

## 2024-05-28 MED ORDER — IOHEXOL 350 MG/ML SOLN
75.0000 mL | Freq: Once | INTRAVENOUS | Status: AC | PRN
  Administered 2024-05-28: 75 mL via INTRAVENOUS

## 2024-05-28 MED ORDER — OXYCODONE-ACETAMINOPHEN 5-325 MG PO TABS: 1.0000 | ORAL_TABLET | Freq: Four times a day (QID) | ORAL | 0 refills | Status: DC | PRN

## 2024-05-28 MED ORDER — ONDANSETRON HCL 4 MG/2ML IJ SOLN
4.0000 mg | Freq: Once | INTRAMUSCULAR | Status: AC
  Administered 2024-05-28: 4 mg via INTRAVENOUS
  Filled 2024-05-28: qty 2

## 2024-05-28 NOTE — ED Triage Notes (Signed)
 Pt had hysterectomy a few days ago, this morning she woke up with extreme abd pain, tightness and pressure coming from her vagina.

## 2024-05-28 NOTE — ED Notes (Signed)
 Patient requested additional pain medication, Smoot, PA notified.

## 2024-05-28 NOTE — Discharge Instructions (Addendum)
 As we discussed, your workup in the ER today was overall reassuring for acute findings.  Laboratory evaluation and CT imaging did not reveal any acute emergent cause of your pain.  Therefore, I expect that you are experiencing normal postoperative pain following your hysterectomy.  I have given you a prescription for Percocet which is a narcotic pain medication you should take as prescribed as needed for severe pain only.  Do not drive or operate heavy machinery while taking this medication as it can be sedating.  I have also given you a prescription for Zofran  you can take as needed for any residual nausea or vomiting.  Please call your surgeon to schedule a close follow-up appointment.  Additionally, your CT scan did show abnormalities in your spleen area, looks like that was present when you were seen here in January.  The radiologist recommends that you have an MRI to further evaluate this on a nonemergent outpatient basis.  Please call your PCP to schedule this when you are able.  Return if development of any new or worsening symptoms.

## 2024-05-28 NOTE — ED Provider Notes (Signed)
 [place] EMERGENCY DEPARTMENT AT [facility] Provider Note   CSN: [id number] Arrival date & time: 05/28/24  1210     Patient presents with: Post-op Problem   [name] is a 43 y.o. female.   Patient with history of hypertension presents today with complaints of postop problem.  She states that she had a laparoscopic hysterectomy on 6/17 that was without complication.  She felt like she was doing well until yesterday evening when she started having increasing pain.  Pain is in her low pelvic region and in her vagina as well.  She notes that she did have some spotting last night but none today.  Denies any discharge.  No fevers or chills.  She reached out to her surgeon and has not heard back.  Was told to go to the ER for evaluation.  She took her prescribed postop pain medicine without any improvement.  Endorses nausea without vomiting.  No diarrhea.  The history is provided by the patient. No language interpreter was used.       Prior to Admission medications   Medication Sig Start Date End Date Taking? Authorizing Provider  acetaminophen  (TYLENOL ) 500 MG tablet Take 1,000 mg by mouth every 6 (six) hours as needed for pain.     [provider]  amLODipine  (NORVASC ) 10 MG tablet Take 0.5 tablets (5 mg total) by mouth daily. 12/27/19   Celestia Rosaline SQUIBB, NP  clonazePAM  (KLONOPIN ) 0.5 MG tablet Take 1 tablet (0.5 mg total) by mouth at bedtime. 04/05/18   Kristy Sharolyn Lenis, PA-C  escitalopram  (LEXAPRO ) 20 MG tablet Take 1 tablet (20 mg total) by mouth daily. 04/05/18   Kristy Sharolyn Lenis, PA-C  hydrochlorothiazide  (HYDRODIURIL ) 25 MG tablet Take 1 tablet every morning by mouth 12/27/19   Celestia Rosaline SQUIBB, NP  metoCLOPramide  (REGLAN ) 5 MG tablet Take 1 tablet (5 mg total) by mouth 2 (two) times daily as needed for nausea. 04/05/18   Kristy Sharolyn Lenis, PA-C  oxyCODONE -acetaminophen  (PERCOCET) 5-325 MG tablet Take 1 every 6 hours for pain has not relieved by  Tylenol  or Motrin 12/25/23   Zammit, Joseph, MD    Allergies: Ibuprofen    Review of Systems  Genitourinary:  Positive for pelvic pain.  All other systems reviewed and are negative.   Updated Vital Signs BP (!) 210/101 (BP Location: Left Arm)   Pulse 64   Temp 98.2 F (36.8 C)   Resp 17   SpO2 98%   Physical Exam Vitals and nursing note reviewed.  Constitutional:      General: She is not in acute distress.    Appearance: Normal appearance. She is normal weight. She is not ill-appearing, toxic-appearing or diaphoretic.  HENT:     Head: Normocephalic and atraumatic.   Cardiovascular:     Rate and Rhythm: Normal rate.  Pulmonary:     Effort: Pulmonary effort is normal. No respiratory distress.  Abdominal:     Comments: Well healing incision sites present to the anterior abdomen with mild TTP. No drainage, erythema warmth  Genitourinary:    Comments: Patient deferred GU exam  Musculoskeletal:        General: Normal range of motion.     Cervical back: Normal range of motion.   Skin:    General: Skin is warm and dry.   Neurological:     General: No focal deficit present.     Mental Status: She is alert.   Psychiatric:  Mood and Affect: Mood normal.        Behavior: Behavior normal.     (all labs ordered are listed, but only abnormal results are displayed) Labs Reviewed  COMPREHENSIVE METABOLIC PANEL WITH GFR - Abnormal; Notable for the following components:      Result Value   CO2 20 (*)    All other components within normal limits  CBC WITH DIFFERENTIAL/PLATELET - Abnormal; Notable for the following components:   Hemoglobin 8.3 (*)    HCT 30.5 (*)    MCV 70.3 (*)    MCH 19.1 (*)    MCHC 27.2 (*)    RDW 20.6 (*)    All other components within normal limits  URINALYSIS, ROUTINE W REFLEX MICROSCOPIC - Abnormal; Notable for the following components:   APPearance HAZY (*)    Ketones, ur 20 (*)    All other components within normal limits  LIPASE, BLOOD   HCG, SERUM, QUALITATIVE    EKG: None  Radiology: CT ABDOMEN PELVIS W CONTRAST Result Date: 05/28/2024 CLINICAL DATA:  Abdominal pain. EXAM: CT ABDOMEN AND PELVIS WITH CONTRAST TECHNIQUE: Multidetector CT imaging of the abdomen and pelvis was performed using the standard protocol following bolus administration of intravenous contrast. RADIATION DOSE REDUCTION: This exam was performed according to the departmental dose-optimization program which includes automated exposure control, adjustment of the mA and/or kV according to patient size and/or use of iterative reconstruction technique. CONTRAST:  75mL OMNIPAQUE  IOHEXOL  350 MG/ML SOLN COMPARISON:  CT abdomen pelvis dated 12/25/2023. FINDINGS: Lower chest: No acute abnormality. No intra-abdominal free air or free fluid. Hepatobiliary: The liver is unremarkable. No biliary dilatation. No calcified gallstone or pericholecystic fluid. Pancreas: Unremarkable. No pancreatic ductal dilatation or surrounding inflammatory changes. Spleen: Indeterminate splenic lesions as seen on the prior CT. MRI may provide better evaluation on a nonemergent/outpatient basis. Adrenals/Urinary Tract: The adrenal glands unremarkable. The kidneys, visualized ureters, and urinary bladder appear unremarkable. Stomach/Bowel: There is no bowel obstruction or active inflammation. The appendix is normal. Vascular/Lymphatic: The abdominal aorta and IVC unremarkable. No portal venous gas. There is no adenopathy. Reproductive: Hysterectomy.  No suspicious adnexal masses. Other: None Musculoskeletal: Degenerative changes of the spine. No acute osseous pathology. IMPRESSION: 1. No acute intra-abdominal or pelvic pathology. 2. Indeterminate splenic lesions as seen on the prior CT. MRI may provide better evaluation on a nonemergent/outpatient basis. Electronically Signed   By: Vanetta Chou M.D.   On: 05/28/2024 18:19     Procedures   Medications Ordered in the ED  morphine  (PF) 4 MG/ML  injection 4 mg (has no administration in time range)  ondansetron  (ZOFRAN ) injection 4 mg (has no administration in time range)                                    Medical Decision Making Amount and/or Complexity of Data Reviewed Labs: ordered. Radiology: ordered.  Risk Prescription drug management.   This patient is a 43 y.o. female who presents to the ED for concern of abdominal pain, this involves an extensive number of treatment options, and is a complaint that carries with it a high risk of complications and morbidity. The emergent differential diagnosis prior to evaluation includes, but is not limited to, post op infection or complication, regular post op pain, AAA, gastroenteritis, appendicitis, Bowel obstruction, Bowel perforation. Gastroparesis, DKA, Hernia, Inflammatory bowel disease, mesenteric ischemia, pancreatitis, peritonitis SBP, volvulus.  This is not an exhaustive  differential.   Past Medical History / Co-morbidities / Social History:  has a past medical history of Anxiety and Hypertension.  Additional history: Chart reviewed. Pertinent results include: Patient had uncomplicated total laparoscopic hysterectomy with bilateral salpingectomy on 6/17 at [facility] with Dr. Raul  Physical Exam: Physical exam performed. The pertinent findings include: overall well appearing, well-healing surgical sites present to the abdomen without any drainage or erythema.  Mild abdominal tenderness generalized throughout.  Lab Tests: I ordered, and personally interpreted labs.  The pertinent results include:  hgb 8.3 consistent with previous, slightly decreased likely due to recent surgery. Bicarb 20, UA with ketones, noninfectious   Imaging Studies: I ordered imaging studies including CT abdomen pelvis. I independently visualized and interpreted imaging which showed   1. No acute intra-abdominal or pelvic pathology. 2. Indeterminate splenic lesions as seen on the prior CT.  MRI may provide better evaluation on a nonemergent/outpatient basis.  I agree with the radiologist interpretation.  Medications: I ordered medication including morphine , zofran   for pain, nausea. Reevaluation of the patient after these medicines showed that the patient improved. I have reviewed the patients home medicines and have made adjustments as needed.   Disposition: After consideration of the diagnostic results and the patients response to treatment, I feel that emergency department workup does not suggest an emergent condition requiring admission or immediate intervention beyond what has been performed at this time. The plan is: discharge with outpatient follow-up and return precautions.  Patient's workup is benign.  She feels better after above interventions.  She has no signs of sepsis or postop infection.  Will send for Percocet for pain control.  PDMP reviewed.  Patient advised not to drive or operate heavy machinery while taking this medication.  Will also send for Zofran  for nausea and vomiting.  Recommend she call her surgeon for close follow-up.  Also advised her of incidental findings seen on CT with recommendations for an outpatient MRI.  Evaluation and diagnostic testing in the emergency department does not suggest an emergent condition requiring admission or immediate intervention beyond what has been performed at this time.  Plan for discharge with close PCP follow-up.  Patient is understanding and amenable with plan, educated on red flag symptoms that would prompt immediate return.  Patient discharged in stable condition.  I discussed this case with my attending physician Dr. Melvenia who cosigned this note including patient's presenting symptoms, physical exam, and planned diagnostics and interventions. Attending physician stated agreement with plan or made changes to plan which were implemented.    Final diagnoses:  Post-operative pain  Abnormal CT of the abdomen    ED Discharge  Orders          Ordered    oxyCODONE -acetaminophen  (PERCOCET/ROXICET) 5-325 MG tablet  Every 6 hours PRN        05/28/24 2042    ondansetron  (ZOFRAN -ODT) 4 MG disintegrating tablet  Every 8 hours PRN        05/28/24 2042          An After Visit Summary was printed and given to the patient.      Nora Lauraine DELENA DEVONNA 05/28/24 2043    Melvenia Motto, MD 05/28/24 [phone]

## 2024-06-23 ENCOUNTER — Emergency Department (HOSPITAL_COMMUNITY)
Admission: EM | Admit: 2024-06-23 | Discharge: 2024-06-23 | Disposition: A | Discharge status: Home / Self Care | Attending: Emergency Medicine | Admitting: Emergency Medicine

## 2024-06-23 ENCOUNTER — Encounter (HOSPITAL_COMMUNITY): Payer: Self-pay

## 2024-06-23 ENCOUNTER — Emergency Department (HOSPITAL_COMMUNITY)

## 2024-06-23 ENCOUNTER — Other Ambulatory Visit: Payer: Self-pay

## 2024-06-23 DIAGNOSIS — M25511 Pain in right shoulder: Secondary | ICD-10-CM | POA: Insufficient documentation

## 2024-06-23 DIAGNOSIS — W108XXA Fall (on) (from) other stairs and steps, initial encounter: Secondary | ICD-10-CM | POA: Diagnosis not present

## 2024-06-23 DIAGNOSIS — S93401A Sprain of unspecified ligament of right ankle, initial encounter: Secondary | ICD-10-CM

## 2024-06-23 DIAGNOSIS — M25571 Pain in right ankle and joints of right foot: Secondary | ICD-10-CM | POA: Diagnosis not present

## 2024-06-23 DIAGNOSIS — M7989 Other specified soft tissue disorders: Secondary | ICD-10-CM | POA: Diagnosis not present

## 2024-06-23 DIAGNOSIS — S40011A Contusion of right shoulder, initial encounter: Secondary | ICD-10-CM

## 2024-06-23 DIAGNOSIS — W19XXXA Unspecified fall, initial encounter: Secondary | ICD-10-CM

## 2024-06-23 MED ORDER — OXYCODONE-ACETAMINOPHEN 5-325 MG PO TABS
1.0000 | ORAL_TABLET | Freq: Four times a day (QID) | ORAL | 0 refills | Status: DC | PRN
Start: 2024-06-23 — End: 2024-07-12

## 2024-06-23 MED ORDER — OXYCODONE-ACETAMINOPHEN 5-325 MG PO TABS
2.0000 | ORAL_TABLET | Freq: Once | ORAL | Status: AC
  Administered 2024-06-23: 2 via ORAL
  Filled 2024-06-23: qty 2

## 2024-06-23 MED ORDER — METHOCARBAMOL 500 MG PO TABS: 500.0000 mg | ORAL_TABLET | Freq: Two times a day (BID) | ORAL | 0 refills | Status: AC

## 2024-06-23 NOTE — Progress Notes (Signed)
 Orthopedic Tech Progress Note Patient Details:  [name] [birth year] [id number]  Ortho Devices Type of Ortho Device: ASO, Shoulder immobilizer Ortho Device/Splint Location: RUE, RLE Ortho Device/Splint Interventions: Ordered, Application, Adjustment   Post Interventions Patient Tolerated: Well Instructions Provided: Care of device, Adjustment of device  [name] 06/23/2024, 9:51 AM

## 2024-06-23 NOTE — ED Provider Notes (Signed)
 [place] EMERGENCY DEPARTMENT AT [facility] Provider Note   CSN: [id number] Arrival date & time: 06/23/24  9360     Patient presents with: Fall (PT stated that she fell down her stairs (aprox. 12 steps) about midnight. PT stated that she hurt her right ankle and right shoulder. PT states she had a hysterectomy 4 weeks ago and still isn't moving well from that causing her to fall down the stairs.)   [name] is a 43 y.o. female.   43 year old female presents with pain to her right shoulder and right ankle.  Patient states she had mechanical fall today when she was try to walk down the stairs and fell approximately 12 steps.  No LOC.  Denies any head or neck pain.  Complains of sharp pain worse with movement to her right ankle and right shoulder.  No chest or abdominal discomfort.  No hip or knee pain       Prior to Admission medications   Medication Sig Start Date End Date Taking? Authorizing Provider  acetaminophen  (TYLENOL ) 500 MG tablet Take 1,000 mg by mouth every 6 (six) hours as needed for pain.     [provider]  amLODipine  (NORVASC ) 10 MG tablet Take 0.5 tablets (5 mg total) by mouth daily. 12/27/19   Celestia Rosaline SQUIBB, NP  clonazePAM  (KLONOPIN ) 0.5 MG tablet Take 1 tablet (0.5 mg total) by mouth at bedtime. 04/05/18   Kristy Sharolyn Lenis, PA-C  escitalopram  (LEXAPRO ) 20 MG tablet Take 1 tablet (20 mg total) by mouth daily. 04/05/18   Kristy Sharolyn Lenis, PA-C  hydrochlorothiazide  (HYDRODIURIL ) 25 MG tablet Take 1 tablet every morning by mouth 12/27/19   Celestia Rosaline SQUIBB, NP  metoCLOPramide  (REGLAN ) 5 MG tablet Take 1 tablet (5 mg total) by mouth 2 (two) times daily as needed for nausea. 04/05/18   Kristy Sharolyn Lenis, PA-C  ondansetron  (ZOFRAN -ODT) 4 MG disintegrating tablet Take 1 tablet (4 mg total) by mouth every 8 (eight) hours as needed. 05/28/24   Smoot, Lauraine LABOR, PA-C  oxyCODONE -acetaminophen  (PERCOCET/ROXICET) 5-325 MG tablet Take 1 tablet by  mouth every 6 (six) hours as needed for severe pain (pain score 7-10). 05/28/24   Smoot, Lauraine LABOR, PA-C    Allergies: Ibuprofen    Review of Systems  All other systems reviewed and are negative.   Updated Vital Signs BP (!) 169/101 (BP Location: Left Arm)   Pulse 73   Temp 99 F (37.2 C) (Oral)   Resp 17   Wt 131.1 kg   LMP 12/03/2023 (Exact Date)   SpO2 99%   BMI 42.68 kg/m   Physical Exam Vitals and nursing note reviewed.  Constitutional:      General: She is not in acute distress.    Appearance: Normal appearance. She is well-developed. She is not toxic-appearing.  HENT:     Head: Normocephalic and atraumatic.  Eyes:     General: Lids are normal.     Conjunctiva/sclera: Conjunctivae normal.     Pupils: Pupils are equal, round, and reactive to light.  Neck:     Thyroid: No thyroid mass.     Trachea: No tracheal deviation.  Cardiovascular:     Rate and Rhythm: Normal rate and regular rhythm.     Heart sounds: Normal heart sounds. No murmur heard.    No gallop.  Pulmonary:     Effort: Pulmonary effort is normal. No respiratory distress.     Breath sounds: Normal breath sounds. No stridor. No decreased  breath sounds, wheezing, rhonchi or rales.  Abdominal:     General: There is no distension.     Palpations: Abdomen is soft.     Tenderness: There is no abdominal tenderness. There is no rebound.  Musculoskeletal:     Right shoulder: Tenderness present. No deformity. Decreased range of motion.     Cervical back: Normal range of motion and neck supple.       Legs:  Skin:    General: Skin is warm and dry.     Findings: No abrasion or rash.  Neurological:     Mental Status: She is alert and oriented to person, place, and time. Mental status is at baseline.     GCS: GCS eye subscore is 4. GCS verbal subscore is 5. GCS motor subscore is 6.     Cranial Nerves: No cranial nerve deficit.     Sensory: No sensory deficit.     Motor: Motor function is intact.  Psychiatric:         Attention and Perception: Attention normal.        Speech: Speech normal.        Behavior: Behavior normal.     (all labs ordered are listed, but only abnormal results are displayed) Labs Reviewed - No data to display  EKG: None  Radiology: No results found.   Procedures   Medications Ordered in the ED  oxyCODONE -acetaminophen  (PERCOCET/ROXICET) 5-325 MG per tablet 2 tablet (has no administration in time range)                                    Medical Decision Making Amount and/or Complexity of Data Reviewed Radiology: ordered.  Risk Prescription drug management.   X-ray of patient's right shoulder, right ankle, right tib-fib without evidence of fracture.  Medicate for pain here with Percocet.  Will give patient sling as well as ASO and referral to orthopedics on-call.  Will also prescribe analgesics and muscle relaxants     Final diagnoses:  None    ED Discharge Orders     None          Dasie Faden, MD 06/23/24 [phone]

## 2024-06-23 NOTE — ED Triage Notes (Signed)
 PT stated that she fell down her stairs (aprox. 12 steps) about midnight. PT stated that she hurt her right ankle and right shoulder. PT states she had a hysterectomy 4 weeks ago and still isn't moving well from that causing her to fall down the stairs.

## 2024-07-11 ENCOUNTER — Other Ambulatory Visit: Payer: Self-pay | Admitting: Orthopaedic Surgery

## 2024-07-12 ENCOUNTER — Other Ambulatory Visit: Payer: Self-pay

## 2024-07-12 ENCOUNTER — Encounter (HOSPITAL_BASED_OUTPATIENT_CLINIC_OR_DEPARTMENT_OTHER): Payer: Self-pay | Admitting: Orthopaedic Surgery

## 2024-07-17 ENCOUNTER — Encounter (HOSPITAL_BASED_OUTPATIENT_CLINIC_OR_DEPARTMENT_OTHER)
Admission: RE | Admit: 2024-07-17 | Discharge: 2024-07-17 | Disposition: A | Discharge status: Home / Self Care | Source: Ambulatory Visit | Attending: Orthopaedic Surgery | Admitting: Orthopaedic Surgery

## 2024-07-17 DIAGNOSIS — Z01810 Encounter for preprocedural cardiovascular examination: Secondary | ICD-10-CM | POA: Diagnosis present

## 2024-07-17 DIAGNOSIS — Z01818 Encounter for other preprocedural examination: Secondary | ICD-10-CM | POA: Diagnosis not present

## 2024-07-17 DIAGNOSIS — Z01812 Encounter for preprocedural laboratory examination: Secondary | ICD-10-CM | POA: Diagnosis present

## 2024-07-17 LAB — BASIC METABOLIC PANEL WITH GFR
Anion gap: 11 (ref 5–15)
BUN: 8 mg/dL (ref 6–20)
CO2: 19 mmol/L — ABNORMAL LOW (ref 22–32)
Calcium: 9.2 mg/dL (ref 8.9–10.3)
Chloride: 106 mmol/L (ref 98–111)
Creatinine, Ser: 0.65 mg/dL (ref 0.44–1.00)
GFR, Estimated: >60 mL/min (ref >60)
Glucose, Bld: 92 mg/dL (ref 70–99)
Potassium: 3.9 mmol/L (ref 3.5–5.1)
Sodium: 136 mmol/L (ref 135–145)

## 2024-07-17 NOTE — Progress Notes (Signed)

## 2024-07-19 ENCOUNTER — Other Ambulatory Visit: Payer: Self-pay

## 2024-07-19 ENCOUNTER — Ambulatory Visit (HOSPITAL_BASED_OUTPATIENT_CLINIC_OR_DEPARTMENT_OTHER): Admitting: Anesthesiology

## 2024-07-19 ENCOUNTER — Ambulatory Visit (HOSPITAL_BASED_OUTPATIENT_CLINIC_OR_DEPARTMENT_OTHER)
Admission: RE | Admit: 2024-07-19 | Discharge: 2024-07-19 | Disposition: A | Discharge status: Home / Self Care | Attending: Orthopaedic Surgery | Admitting: Orthopaedic Surgery

## 2024-07-19 ENCOUNTER — Encounter (HOSPITAL_BASED_OUTPATIENT_CLINIC_OR_DEPARTMENT_OTHER)
Admission: RE | Disposition: A | Discharge status: Home / Self Care | Payer: Self-pay | Source: Home / Self Care | Attending: Orthopaedic Surgery

## 2024-07-19 ENCOUNTER — Encounter (HOSPITAL_BASED_OUTPATIENT_CLINIC_OR_DEPARTMENT_OTHER): Payer: Self-pay | Admitting: Orthopaedic Surgery

## 2024-07-19 ENCOUNTER — Ambulatory Visit (HOSPITAL_COMMUNITY)

## 2024-07-19 DIAGNOSIS — S93431A Sprain of tibiofibular ligament of right ankle, initial encounter: Secondary | ICD-10-CM | POA: Diagnosis present

## 2024-07-19 DIAGNOSIS — M25871 Other specified joint disorders, right ankle and foot: Secondary | ICD-10-CM

## 2024-07-19 DIAGNOSIS — M25371 Other instability, right ankle: Secondary | ICD-10-CM | POA: Insufficient documentation

## 2024-07-19 DIAGNOSIS — I10 Essential (primary) hypertension: Secondary | ICD-10-CM

## 2024-07-19 DIAGNOSIS — X58XXXA Exposure to other specified factors, initial encounter: Secondary | ICD-10-CM | POA: Diagnosis not present

## 2024-07-19 DIAGNOSIS — Z87891 Personal history of nicotine dependence: Secondary | ICD-10-CM | POA: Insufficient documentation

## 2024-07-19 HISTORY — PX: ARTHROSCOPY, ANKLE WITH DEBRIDEMENT: SHX7318

## 2024-07-19 HISTORY — PX: SYNDESMOSIS REPAIR: SHX5182

## 2024-07-19 SURGERY — ARTHROSCOPY, ANKLE WITH DEBRIDEMENT
Anesthesia: General | Site: Ankle | Laterality: Right

## 2024-07-19 MED ORDER — DEXAMETHASONE SODIUM PHOSPHATE 10 MG/ML IJ SOLN
INTRAMUSCULAR | Status: DC | PRN
  Administered 2024-07-19: 10 mg via INTRAVENOUS

## 2024-07-19 MED ORDER — EPHEDRINE 5 MG/ML INJ
INTRAVENOUS | Status: AC
  Filled 2024-07-19: qty 5

## 2024-07-19 MED ORDER — FENTANYL CITRATE (PF) 100 MCG/2ML IJ SOLN
50.0000 ug | Freq: Once | INTRAMUSCULAR | Status: AC
  Administered 2024-07-19: 50 ug via INTRAVENOUS

## 2024-07-19 MED ORDER — SCOPOLAMINE 1 MG/3DAYS TD PT72
1.0000 | MEDICATED_PATCH | TRANSDERMAL | Status: DC
  Administered 2024-07-19: 1.5 mg via TRANSDERMAL

## 2024-07-19 MED ORDER — CEFAZOLIN SODIUM-DEXTROSE 2-4 GM/100ML-% IV SOLN
INTRAVENOUS | Status: AC
  Filled 2024-07-19: qty 100

## 2024-07-19 MED ORDER — ONDANSETRON HCL 4 MG/2ML IJ SOLN
INTRAMUSCULAR | Status: AC
  Filled 2024-07-19: qty 2

## 2024-07-19 MED ORDER — MIDAZOLAM HCL 2 MG/2ML IJ SOLN
INTRAMUSCULAR | Status: AC
  Filled 2024-07-19: qty 2

## 2024-07-19 MED ORDER — SCOPOLAMINE 1 MG/3DAYS TD PT72
MEDICATED_PATCH | TRANSDERMAL | Status: AC
  Filled 2024-07-19: qty 1

## 2024-07-19 MED ORDER — LIDOCAINE 2% (20 MG/ML) 5 ML SYRINGE
INTRAMUSCULAR | Status: AC
  Filled 2024-07-19: qty 5

## 2024-07-19 MED ORDER — FENTANYL CITRATE (PF) 100 MCG/2ML IJ SOLN
INTRAMUSCULAR | Status: AC
Start: 2024-07-19 — End: 2024-07-19
  Filled 2024-07-19: qty 2

## 2024-07-19 MED ORDER — BUPIVACAINE HCL (PF) 0.5 % IJ SOLN
INTRAMUSCULAR | Status: AC
  Filled 2024-07-19: qty 30

## 2024-07-19 MED ORDER — FENTANYL CITRATE (PF) 100 MCG/2ML IJ SOLN
INTRAMUSCULAR | Status: DC | PRN
  Administered 2024-07-19 (×2): 50 ug via INTRAVENOUS

## 2024-07-19 MED ORDER — LIDOCAINE 2% (20 MG/ML) 5 ML SYRINGE
INTRAMUSCULAR | Status: DC | PRN
  Administered 2024-07-19: 40 mg via INTRAVENOUS

## 2024-07-19 MED ORDER — ACETAMINOPHEN 10 MG/ML IV SOLN
INTRAVENOUS | Status: AC
  Filled 2024-07-19: qty 100

## 2024-07-19 MED ORDER — OXYCODONE HCL 5 MG PO TABS
5.0000 mg | ORAL_TABLET | Freq: Once | ORAL | Status: AC | PRN
  Administered 2024-07-19: 5 mg via ORAL

## 2024-07-19 MED ORDER — FENTANYL CITRATE (PF) 100 MCG/2ML IJ SOLN
25.0000 ug | INTRAMUSCULAR | Status: DC | PRN
  Administered 2024-07-19: 50 ug via INTRAVENOUS

## 2024-07-19 MED ORDER — SODIUM CHLORIDE 0.9 % IR SOLN
Status: DC | PRN
  Administered 2024-07-19: 500 mL

## 2024-07-19 MED ORDER — DEXAMETHASONE SODIUM PHOSPHATE 10 MG/ML IJ SOLN
INTRAMUSCULAR | Status: AC
  Filled 2024-07-19: qty 1

## 2024-07-19 MED ORDER — MIDAZOLAM HCL 2 MG/2ML IJ SOLN
2.0000 mg | Freq: Once | INTRAMUSCULAR | Status: AC
  Administered 2024-07-19: 2 mg via INTRAVENOUS

## 2024-07-19 MED ORDER — OXYCODONE HCL 5 MG PO TABS
5.0000 mg | ORAL_TABLET | ORAL | 0 refills | Status: AC | PRN
Start: 2024-07-19 — End: ?

## 2024-07-19 MED ORDER — ASPIRIN 325 MG PO TABS: ORAL_TABLET | ORAL | 0 refills | Status: AC

## 2024-07-19 MED ORDER — CEFAZOLIN SODIUM-DEXTROSE 3-4 GM/150ML-% IV SOLN
3.0000 g | INTRAVENOUS | Status: AC
  Administered 2024-07-19: 3 g via INTRAVENOUS

## 2024-07-19 MED ORDER — EPHEDRINE SULFATE (PRESSORS) 50 MG/ML IJ SOLN
INTRAMUSCULAR | Status: DC | PRN
  Administered 2024-07-19: 5 mg via INTRAVENOUS

## 2024-07-19 MED ORDER — FENTANYL CITRATE (PF) 100 MCG/2ML IJ SOLN
INTRAMUSCULAR | Status: AC
  Filled 2024-07-19: qty 2

## 2024-07-19 MED ORDER — CEFAZOLIN SODIUM-DEXTROSE 1-4 GM/50ML-% IV SOLN
INTRAVENOUS | Status: AC
  Filled 2024-07-19: qty 50

## 2024-07-19 MED ORDER — POVIDONE-IODINE 7.5 % EX SOLN: Freq: Once | CUTANEOUS | Status: DC

## 2024-07-19 MED ORDER — ONDANSETRON HCL 4 MG/2ML IJ SOLN
INTRAMUSCULAR | Status: DC | PRN
Start: 2024-07-19 — End: 2024-07-19
  Administered 2024-07-19: 4 mg via INTRAVENOUS

## 2024-07-19 MED ORDER — ACETAMINOPHEN 10 MG/ML IV SOLN
1000.0000 mg | Freq: Once | INTRAVENOUS | Status: DC | PRN
  Administered 2024-07-19: 1000 mg via INTRAVENOUS

## 2024-07-19 MED ORDER — DROPERIDOL 2.5 MG/ML IJ SOLN: 0.6250 mg | Freq: Once | INTRAMUSCULAR | Status: DC | PRN

## 2024-07-19 MED ORDER — OXYCODONE HCL 5 MG/5ML PO SOLN: 5.0000 mg | Freq: Once | ORAL | Status: AC | PRN

## 2024-07-19 MED ORDER — PROPOFOL 10 MG/ML IV BOLUS
INTRAVENOUS | Status: AC
  Filled 2024-07-19: qty 20

## 2024-07-19 MED ORDER — POVIDONE-IODINE 10 % EX SWAB: 2.0000 | Freq: Once | CUTANEOUS | Status: DC

## 2024-07-19 MED ORDER — LACTATED RINGERS IV SOLN: INTRAVENOUS | Status: DC

## 2024-07-19 MED ORDER — PROPOFOL 10 MG/ML IV BOLUS
INTRAVENOUS | Status: DC | PRN
  Administered 2024-07-19: 200 mg via INTRAVENOUS

## 2024-07-19 MED ORDER — OXYCODONE HCL 5 MG PO TABS
ORAL_TABLET | ORAL | Status: AC
  Filled 2024-07-19: qty 1

## 2024-07-19 MED ORDER — PHENYLEPHRINE 80 MCG/ML (10ML) SYRINGE FOR IV PUSH (FOR BLOOD PRESSURE SUPPORT)
PREFILLED_SYRINGE | INTRAVENOUS | Status: AC
  Filled 2024-07-19: qty 10

## 2024-07-19 SURGICAL SUPPLY — 77 items
BENZOIN TINCTURE PRP APPL 2/3 (GAUZE/BANDAGES/DRESSINGS) IMPLANT
BIT DRILL 2.5 CANN STRL (BIT) IMPLANT
BLADE SURG 15 STRL LF DISP TIS (BLADE) ×2 IMPLANT
BNDG COHESIVE 4X5 TAN STRL LF (GAUZE/BANDAGES/DRESSINGS) IMPLANT
BNDG COMPR ESMARK 4X3 LF (GAUZE/BANDAGES/DRESSINGS) IMPLANT
BNDG COMPR ESMARK 6X3 LF (GAUZE/BANDAGES/DRESSINGS) ×1 IMPLANT
BNDG ELASTIC 4INX 5YD STR LF (GAUZE/BANDAGES/DRESSINGS) IMPLANT
BNDG ELASTIC 6INX 5YD STR LF (GAUZE/BANDAGES/DRESSINGS) ×2 IMPLANT
CHLORAPREP W/TINT 26 (MISCELLANEOUS) ×1 IMPLANT
COVER BACK TABLE 60X90IN (DRAPES) IMPLANT
CUFF TOURN SGL QUICK 42 (TOURNIQUET CUFF) ×1 IMPLANT
CUFF TRNQT CYL 34X4.125X (TOURNIQUET CUFF) IMPLANT
DISSECTOR 3.5MM X 13CM CVD (MISCELLANEOUS) IMPLANT
DISSECTOR 3.8MM X 13CM (MISCELLANEOUS) ×1 IMPLANT
DISSECTOR 4.0MM X 13CM (MISCELLANEOUS) IMPLANT
DRAPE C-ARM 42X72 X-RAY (DRAPES) ×1 IMPLANT
DRAPE C-ARMOR (DRAPES) ×1 IMPLANT
DRAPE EXTREMITY T 121X128X90 (DISPOSABLE) ×1 IMPLANT
DRAPE IMP U-DRAPE 54X76 (DRAPES) ×1 IMPLANT
DRAPE OEC MINIVIEW 54X84 (DRAPES) IMPLANT
DRAPE U-SHAPE 47X51 STRL (DRAPES) ×1 IMPLANT
ELECTRODE REM PT RTRN 9FT ADLT (ELECTROSURGICAL) ×1 IMPLANT
EXCALIBUR 3.8MM X 13CM (MISCELLANEOUS) IMPLANT
GAUZE PAD ABD 8X10 STRL (GAUZE/BANDAGES/DRESSINGS) ×1 IMPLANT
GAUZE SPONGE 4X4 12PLY STRL (GAUZE/BANDAGES/DRESSINGS) ×1 IMPLANT
GAUZE XEROFORM 1X8 LF (GAUZE/BANDAGES/DRESSINGS) ×1 IMPLANT
GLOVE BIOGEL M STRL SZ7.5 (GLOVE) ×2 IMPLANT
GLOVE BIOGEL PI IND STRL 7.0 (GLOVE) IMPLANT
GLOVE BIOGEL PI IND STRL 7.5 (GLOVE) IMPLANT
GLOVE BIOGEL PI IND STRL 8 (GLOVE) ×2 IMPLANT
GLOVE SURG SS PI 7.0 STRL IVOR (GLOVE) IMPLANT
GOWN STRL REUS W/ TWL LRG LVL3 (GOWN DISPOSABLE) ×1 IMPLANT
GOWN STRL REUS W/ TWL XL LVL3 (GOWN DISPOSABLE) ×2 IMPLANT
MANIFOLD NEPTUNE II (INSTRUMENTS) ×1 IMPLANT
MAT PREVALON FULL STRYKER (MISCELLANEOUS) IMPLANT
NDL 18GX1X1/2 (RX/OR ONLY) (NEEDLE) ×1 IMPLANT
NDL KEITH (NEEDLE) IMPLANT
NDL SAFETY ECLIPSE 18X1.5 (NEEDLE) ×1 IMPLANT
NDL SUT 6 .5 CRC .975X.05 MAYO (NEEDLE) IMPLANT
NEEDLE 18GX1X1/2 (RX/OR ONLY) (NEEDLE) ×1 IMPLANT
NEEDLE KEITH (NEEDLE) IMPLANT
NS IRRIG 1000ML POUR BTL (IV SOLUTION) ×1 IMPLANT
PACK ARTHROSCOPY DSU (CUSTOM PROCEDURE TRAY) ×1 IMPLANT
PACK BASIN DAY SURGERY FS (CUSTOM PROCEDURE TRAY) ×1 IMPLANT
PAD CAST 4YDX4 CTTN HI CHSV (CAST SUPPLIES) ×1 IMPLANT
PADDING CAST SYNTHETIC 4X4 STR (CAST SUPPLIES) ×4 IMPLANT
PENCIL SMOKE EVACUATOR (MISCELLANEOUS) ×1 IMPLANT
PLATE LOCK THIRD TUBULAR 4H (Plate) IMPLANT
SCREW BONE KREULOCK 3.5X14 TI (Screw) IMPLANT
SCREW LO-PRO TI 3.5X16MM (Screw) IMPLANT
SHEET MEDIUM DRAPE 40X70 STRL (DRAPES) ×1 IMPLANT
SLEEVE SCD COMPRESS KNEE MED (STOCKING) ×1 IMPLANT
SPIKE FLUID TRANSFER (MISCELLANEOUS) IMPLANT
SPLINT PLASTER CAST FAST 5X30 (CAST SUPPLIES) ×20 IMPLANT
SPONGE T-LAP 18X18 ~~LOC~~+RFID (SPONGE) ×1 IMPLANT
STAPLER SKIN PROX WIDE 3.9 (STAPLE) IMPLANT
STOCKINETTE 6 STRL (DRAPES) ×1 IMPLANT
STOCKINETTE SYNTHETIC 4 NONSTR (MISCELLANEOUS) ×1 IMPLANT
STRAP ANKLE DISTRACTOR (MISCELLANEOUS) IMPLANT
STRAP ANKLE FOOT DISTRACTOR (ORTHOPEDIC SUPPLIES) IMPLANT
STRIP CLOSURE SKIN 1/2X4 (GAUZE/BANDAGES/DRESSINGS) IMPLANT
SUCTION TUBE FRAZIER 10FR DISP (SUCTIONS) ×1 IMPLANT
SUT BONE WAX W31G (SUTURE) IMPLANT
SUT ETHILON 3 0 PS 1 (SUTURE) ×1 IMPLANT
SUT MNCRL AB 3-0 PS2 18 (SUTURE) ×1 IMPLANT
SUT PDS AB 2-0 CT2 27 (SUTURE) IMPLANT
SUT VIC AB 2-0 SH 27XBRD (SUTURE) ×1 IMPLANT
SUT VIC AB 3-0 FS2 27 (SUTURE) IMPLANT
SUT VICRYL 0 SH 27 (SUTURE) IMPLANT
SUTURE FIBERWR #2 38 T-5 BLUE (SUTURE) IMPLANT
SUTURE FIBERWR 2-0 18 17.9 3/8 (SUTURE) IMPLANT
SYNDESMOSIS TIGHTROPE XP (Orthopedic Implant) IMPLANT
SYR BULB EAR ULCER 3OZ GRN STR (SYRINGE) ×1 IMPLANT
TOWEL GREEN STERILE FF (TOWEL DISPOSABLE) ×2 IMPLANT
TUBE CONNECTING 20X1/4 (TUBING) IMPLANT
TUBING ARTHROSCOPY IRRIG 16FT (MISCELLANEOUS) ×1 IMPLANT
UNDERPAD 30X36 HEAVY ABSORB (UNDERPADS AND DIAPERS) ×1 IMPLANT

## 2024-07-19 NOTE — Transfer of Care (Signed)
 Immediate Anesthesia Transfer of Care Note  Patient: [name]  Procedure(s) Performed: ARTHROSCOPY, ANKLE WITH DEBRIDEMENT (Right: Ankle) REPAIR, SYNDESMOSIS, ANKLE (Right: Ankle)  Patient Location: PACU  Anesthesia Type:GA combined with regional for post-op pain  Level of Consciousness: drowsy, patient cooperative, and responds to stimulation  Airway & Oxygen Therapy: Patient Spontanous Breathing and Patient connected to face mask oxygen  Post-op Assessment: Report given to RN and Post -op Vital signs reviewed and stable  Post vital signs: Reviewed and stable  Last Vitals:  Vitals Value Taken Time  BP    Temp    Pulse 88 07/19/24 14:11  Resp 11 07/19/24 14:11  SpO2 100 % 07/19/24 14:11  Vitals shown include unfiled device data.  Last Pain:  Vitals:   07/19/24 1018  TempSrc: Temporal  PainSc: 0-No pain      Patients Stated Pain Goal: 3 (07/19/24 1018)  Complications: No notable events documented.

## 2024-07-19 NOTE — Progress Notes (Signed)
 Assisted Dr. Tilford with right, adductor canal, popliteal, ultrasound guided block. Side rails up, monitors on throughout procedure. See vital signs in flow sheet. Tolerated Procedure well.

## 2024-07-19 NOTE — Anesthesia Preprocedure Evaluation (Addendum)
 Anesthesia Evaluation  Patient identified by MRN, date of birth, ID band Patient awake    Reviewed: Allergy & Precautions, NPO status , Patient's Chart, lab work & pertinent test results  Airway Mallampati: I  TM Distance: >3 FB Neck ROM: Full    Dental  (+) Teeth Intact, Dental Advisory Given   Pulmonary former smoker   breath sounds clear to auscultation       Cardiovascular hypertension,  Rhythm:Regular Rate:Normal     Neuro/Psych   Anxiety        GI/Hepatic negative GI ROS, Neg liver ROS,,,  Endo/Other  negative endocrine ROS    Renal/GU negative Renal ROS     Musculoskeletal negative musculoskeletal ROS (+)    Abdominal   Peds  Hematology negative hematology ROS (+)   Anesthesia Other Findings   Reproductive/Obstetrics                              Anesthesia Physical Anesthesia Plan  ASA: 3  Anesthesia Plan: General   Post-op Pain Management: Regional block*   Induction: Intravenous  PONV Risk Score and Plan: 4 or greater and Ondansetron, Dexamethasone, Midazolam and Scopolamine patch - Pre-op  Airway Management Planned: LMA  Additional Equipment: None  Intra-op Plan:   Post-operative Plan: Extubation in OR  Informed Consent: I have reviewed the patients History and Physical, chart, labs and discussed the procedure including the risks, benefits and alternatives for the proposed anesthesia with the patient or authorized representative who has indicated his/her understanding and acceptance.     Dental advisory given  Plan Discussed with: CRNA  Anesthesia Plan Comments:          Anesthesia Quick Evaluation

## 2024-07-19 NOTE — H&P (Signed)
 PREOPERATIVE H&P  Chief Complaint: Right ankle pain  HPI: [name] is a 43 y.o. female who presents for preoperative history and physical with a diagnosis of right high ankle sprain with syndesmosis instability and likely intra-articular hematoma. Symptoms are rated as moderate to severe, and have been worsening.  This is significantly impairing activities of daily living.  She has elected for surgical management.   Past Medical History:  Diagnosis Date   Anxiety    Hypertension    Past Surgical History:  Procedure Laterality Date   ABDOMINAL HYSTERECTOMY     Social History   Socioeconomic History   Marital status: Single    Spouse name: Not on file   Number of children: Not on file   Years of education: Not on file   Highest education level: Not on file  Occupational History   Not on file  Tobacco Use   Smoking status: Former   Smokeless tobacco: Never  Vaping Use   Vaping status: Never Used  Substance and Sexual Activity   Alcohol use: Yes    Alcohol/week: 2.0 - 3.0 standard drinks of alcohol    Types: 2 - 3 Cans of beer per week    Comment: social drinker, on the weekends   Drug use: No   Sexual activity: Yes    Birth control/protection: None  Other Topics Concern   Not on file  Social History Narrative   Not on file   Social Drivers of Health   Financial Resource Strain: High Risk (11/18/2022)   Received from [facility]   Overall Financial Resource Strain (CARDIA)    Difficulty of Paying Living Expenses: Very hard  Food Insecurity: No Food Insecurity (05/03/2024)   Received from [facility]   Hunger Vital Sign    Within the past 12 months, you worried that your food would run out before you got the money to buy more.: Never true    Within the past 12 months, the food you bought just didn't last and you didn't have money to get more.: Never true  Transportation Needs: No Transportation Needs (05/03/2024)   Received from [facility] - Transportation    In the past 12 months, has lack of transportation kept you from medical appointments or from getting medications?: No    Lack of Transportation (Non-Medical): No  Physical Activity: Inactive (09/18/2021)   Received from [facility]   Exercise Vital Sign    On average, how many days per week do you engage in moderate to strenuous exercise (like a brisk walk)?: 0 days    On average, how many minutes do you engage in exercise at this level?: 0 min  Stress: Stress Concern Present (11/18/2022)   Received from [facility] - Occupational Stress Questionnaire    Feeling of Stress : Very much  Social Connections: Socially Integrated (11/18/2022)   Received from [facility]   Social Network    How would you rate your social network (family, work, friends)?: Good participation with social networks   History reviewed. No pertinent family history. Allergies  Allergen Reactions   Lisinopril Other (See Comments)   Ibuprofen Nausea And Vomiting and Rash   Prior to Admission medications   Medication Sig Start Date End Date Taking? Authorizing Provider  amLODipine (NORVASC) 10 MG tablet Take 0.5 tablets (5 mg total) by mouth daily. 12/27/19  Yes Celestia Rosaline SQUIBB, NP  escitalopram (LEXAPRO)  20 MG tablet Take 1 tablet (20 mg total) by mouth daily. 04/05/18  Yes Kristy Sharolyn Lenis, PA-C  hydrochlorothiazide (HYDRODIURIL) 25 MG tablet Take 1 tablet every morning by mouth 12/27/19  Yes Edwards, Rosaline SQUIBB, NP  methocarbamol (ROBAXIN) 500 MG tablet Take 1 tablet (500 mg total) by mouth 2 (two) times daily. 06/23/24  Yes Dasie Faden, MD  acetaminophen (TYLENOL) 500 MG tablet Take 1,000 mg by mouth every 6 (six) hours as needed for pain.     [provider]  ondansetron (ZOFRAN-ODT) 4 MG disintegrating tablet Take 1 tablet (4 mg total) by mouth every 8 (eight) hours as needed. 05/28/24   Smoot, Sarah  A, PA-C     Positive ROS: All other systems have been reviewed and were otherwise negative with the exception of those mentioned in the HPI and as above.  Physical Exam:  Vitals:   07/19/24 1018  BP: 123/86  Pulse: 70  Resp: (!) 24  Temp: 97.7 F (36.5 C)  SpO2: 99%   General: Alert, no acute distress Cardiovascular: No pedal edema Respiratory: No cyanosis, no use of accessory musculature GI: No organomegaly, abdomen is soft and non-tender Skin: No lesions in the area of chief complaint Neurologic: Sensation intact distally Psychiatric: Patient is competent for consent with normal mood and affect Lymphatic: No axillary or cervical lymphadenopathy  MUSCULOSKELETAL: Right ankle with swelling and pain.  Ecchymosis present.  Tender to palpation along the syndesmosis and lateral ankle.  Some anterior ankle tenderness.  Pain with range of motion.  Distally foot is warm and well-perfused with intact sensation.  Assessment: Right ankle injury with syndesmosis instability and intra-articular hematoma and likely capsular avulsions   Plan: Plan for right ankle arthroscopic debridement with open treatment of her syndesmosis.  We discussed the risks, benefits and alternatives of surgery which include but are not limited to wound healing complications, infection, nonunion, malunion, need for further surgery, damage to surrounding structures and continued pain.  They understand there is no guarantees to an acceptable outcome.  After weighing these risks they opted to proceed with surgery.     Emily JONELLE Pae, MD    07/19/2024 10:54 AM

## 2024-07-19 NOTE — Anesthesia Postprocedure Evaluation (Signed)
 Anesthesia Post Note  Patient: Mica L Higginson  Procedure(s) Performed: ARTHROSCOPY, ANKLE WITH DEBRIDEMENT (Right: Ankle) REPAIR, SYNDESMOSIS, ANKLE (Right: Ankle)     Patient location during evaluation: PACU Anesthesia Type: General Level of consciousness: awake and alert Pain management: pain level controlled Vital Signs Assessment: post-procedure vital signs reviewed and stable Respiratory status: spontaneous breathing, nonlabored ventilation, respiratory function stable and patient connected to nasal cannula oxygen Cardiovascular status: blood pressure returned to baseline and stable Postop Assessment: no apparent nausea or vomiting Anesthetic complications: no   No notable events documented.  Last Vitals:  Vitals:   07/19/24 1421 07/19/24 1430  BP:  128/73  Pulse: 75 67  Resp: 15 (!) 25  Temp:    SpO2: 97% 95%               Franky JONETTA Bald

## 2024-07-19 NOTE — Progress Notes (Signed)
 Last received oxycodone at 3pm written on dc paperwork.

## 2024-07-19 NOTE — Discharge Instructions (Addendum)
 [facility] POST-OP INSTRUCTIONS   Pain Management The numbing medicine and your leg will last around 18 hours, take a dose of your pain medicine as soon as you feel it wearing off to avoid rebound pain. Keep your foot elevated above heart level.  Make sure that your heel hangs free ('floats'). Take all prescribed medication as directed. If taking narcotic pain medication you may want to use an over-the-counter stool softener to avoid constipation. You may take over-the-counter NSAIDs (ibuprofen, naproxen, etc.) as well as over-the-counter acetaminophen  as directed on the packaging as a supplement for your pain and may also use it to wean away from the prescription medication.  Activity Non-weightbearing Keep splint intact  First Postoperative Visit Your first postop visit will be at least 2 weeks after surgery.  This should be scheduled when you schedule surgery. If you do not have a postoperative visit scheduled please call [phone] to schedule an appointment. At the appointment your incision will be evaluated for suture removal, x-rays will be obtained if necessary.  General Instructions Swelling is very common after foot and ankle surgery.  It often takes 3 months for the foot and ankle to begin to feel comfortable.  Some amount of swelling will persist for 6-12 months. DO NOT change the dressing.  If there is a problem with the dressing (too tight, loose, gets wet, etc.) please contact Dr. Isiah office. DO NOT get the dressing wet.  For showers you can use an over-the-counter cast cover or wrap a washcloth around the top of your dressing and then cover it with a plastic bag and tape it to your leg. DO NOT soak the incision (no tubs, pools, bath, etc.) until you have approval from Dr. ELSA.  Contact Dr. Nadara office or go to Emergency Room if: Temperature above 101 F. Increasing pain that is unresponsive to pain medication or elevation Excessive redness or  swelling in your foot Dressing problems - excessive bloody drainage, looseness or tightness, or if dressing gets wet Develop pain, swelling, warmth, or discoloration of your calf    Post Anesthesia Home Care Instructions  Activity: Get plenty of rest for the remainder of the day. A responsible individual must stay with you for 24 hours following the procedure.  For the next 24 hours, DO NOT: -Drive a car -Advertising copywriter -Drink alcoholic beverages -Take any medication unless instructed by your physician -Make any legal decisions or sign important papers.  Meals: Start with liquid foods such as gelatin or soup. Progress to regular foods as tolerated. Avoid greasy, spicy, heavy foods. If nausea and/or vomiting occur, drink only clear liquids until the nausea and/or vomiting subsides. Call your physician if vomiting continues.  Special Instructions/Symptoms: Your throat may feel dry or sore from the anesthesia or the breathing tube placed in your throat during surgery. If this causes discomfort, gargle with warm salt water. The discomfort should disappear within 24 hours.  If you had a scopolamine  patch placed behind your ear for the management of post- operative nausea and/or vomiting:  1. The medication in the patch is effective for 72 hours, after which it should be removed.  Wrap patch in a tissue and discard in the trash. Wash hands thoroughly with soap and water. 2. You may remove the patch earlier than 72 hours if you experience unpleasant side effects which may include dry mouth, dizziness or visual disturbances. 3. Avoid touching the patch. Wash your hands with soap and water after contact with  the patch.     Last received tylenol at 2:45pm        Regional Anesthesia Blocks  1. You may not be able to move or feel the blocked extremity after a regional anesthetic block. This may last may last from 3-48 hours after placement, but it will go away. The length of time  depends on the medication injected and your individual response to the medication. As the nerves start to wake up, you may experience tingling as the movement and feeling returns to your extremity. If the numbness and inability to move your extremity has not gone away after 48 hours, please call your surgeon.   2. The extremity that is blocked will need to be protected until the numbness is gone and the strength has returned. Because you cannot feel it, you will need to take extra care to avoid injury. Because it may be weak, you may have difficulty moving it or using it. You may not know what position it is in without looking at it while the block is in effect.  3. For blocks in the legs and feet, returning to weight bearing and walking needs to be done carefully. You will need to wait until the numbness is entirely gone and the strength has returned. You should be able to move your leg and foot normally before you try and bear weight or walk. You will need someone to be with you when you first try to ensure you do not fall and possibly risk injury.  4. Bruising and tenderness at the needle site are common side effects and will resolve in a few days.  5. Persistent numbness or new problems with movement should be communicated to the surgeon or the [facility] [phone] [facility] [phone]).

## 2024-07-19 NOTE — Anesthesia Procedure Notes (Signed)
 Procedure Name: LMA Insertion Date/Time: 07/19/2024 12:58 PM  Performed by: Frost Kayla MATSU, CRNAPre-anesthesia Checklist: Patient identified, Emergency Drugs available, Suction available and Patient being monitored Patient Re-evaluated:Patient Re-evaluated prior to induction Oxygen Delivery Method: Circle system utilized Preoxygenation: Pre-oxygenation with 100% oxygen LMA: LMA inserted LMA Size: 4.0 Number of attempts: 1 Placement Confirmation: positive ETCO2 Tube secured with: Tape Dental Injury: Teeth and Oropharynx as per pre-operative assessment

## 2024-07-20 ENCOUNTER — Encounter (HOSPITAL_BASED_OUTPATIENT_CLINIC_OR_DEPARTMENT_OTHER): Payer: Self-pay | Admitting: Orthopaedic Surgery

## 2024-08-08 MED ORDER — ROPIVACAINE HCL 5 MG/ML IJ SOLN
INTRAMUSCULAR | Status: DC | PRN
  Administered 2024-07-19: 15 mL via PERINEURAL

## 2024-08-08 MED ORDER — BUPIVACAINE-EPINEPHRINE (PF) 0.5% -1:200000 IJ SOLN
INTRAMUSCULAR | Status: DC | PRN
  Administered 2024-07-19: 30 mL via PERINEURAL

## 2024-08-08 NOTE — Addendum Note (Signed)
 Addendum  created 08/08/24 1839 by Nelda Luckey D, MD   Child order released for a procedure order, Clinical Note Signed, Intraprocedure Blocks edited, Intraprocedure Meds edited, Order Canceled from Note, SmartForm saved

## 2024-08-08 NOTE — Anesthesia Procedure Notes (Signed)
 Anesthesia Regional Block: Popliteal block   Pre-Anesthetic Checklist: , timeout performed,  Correct Patient, Correct Site, Correct Laterality,  Correct Procedure, Correct Position, site marked,  Risks and benefits discussed,  Surgical consent,  Pre-op evaluation,  At surgeon's request and post-op pain management  Laterality: Right  Prep: chloraprep       Needles:  Injection technique: Single-shot  Needle Type: Echogenic Stimulator Needle     Needle Length: 9cm  Needle Gauge: 21     Additional Needles:   Procedures:,,,, ultrasound used (permanent image in chart),,    Narrative:  Start time: 08/08/2024 11:30 AM End time: 08/08/2024 11:35 AM Injection made incrementally with aspirations every 5 mL.  Performed by: Personally  Anesthesiologist: Tilford Franky BIRCH, MD  Additional Notes: Discussed risks and benefits of the nerve block in detail, including but not limited vascular injury, permanent nerve damage and infection.   Patient tolerated the procedure well. Local anesthetic introduced in an incremental fashion under minimal resistance after negative aspirations. No paresthesias were elicited. After completion of the procedure, no acute issues were identified and patient continued to be monitored by RN.

## 2024-08-08 NOTE — Anesthesia Procedure Notes (Signed)
 Anesthesia Regional Block: Adductor canal block   Pre-Anesthetic Checklist: , timeout performed,  Correct Patient, Correct Site, Correct Laterality,  Correct Procedure, Correct Position, site marked,  Risks and benefits discussed,  Surgical consent,  Pre-op evaluation,  At surgeon's request and post-op pain management  Laterality: Right  Prep: chloraprep       Needles:  Injection technique: Single-shot  Needle Type: Echogenic Stimulator Needle     Needle Length: 9cm  Needle Gauge: 21     Additional Needles:   Procedures:,,,, ultrasound used (permanent image in chart),,    Narrative:  Start time: 08/08/2024 11:35 AM End time: 08/08/2024 11:40 AM Injection made incrementally with aspirations every 5 mL.  Performed by: Personally  Anesthesiologist: Tilford Franky BIRCH, MD  Additional Notes: Discussed risks and benefits of the nerve block in detail, including but not limited vascular injury, permanent nerve damage and infection.   Patient tolerated the procedure well. Local anesthetic introduced in an incremental fashion under minimal resistance after negative aspirations. No paresthesias were elicited. After completion of the procedure, no acute issues were identified and patient continued to be monitored by RN.

## 2024-08-20 NOTE — Op Note (Signed)
 [name] Such female 43 y.o. 07/19/2024  PreOperative Diagnosis: Right ankle instability Right ankle intra-articular derangement Right syndesmosis disruption  PostOperative Diagnosis: Same  PROCEDURE: Right ankle arthroscopic debridement, extensive Open reduction internal fixation of right syndesmosis  SURGEON: Lonni Pae, MD  ASSISTANT: Jesse [place], PA-C was necessary for patient positioning, prep, drape assistance with arthroscopy equipment and placement of hardware and closure.  ANESTHESIA: General With peripheral nerve block  FINDINGS: See below  IMPLANTS: Arthrex one third tubular plate and tight rope  INDICATIONS:42 y.o. female sustained the above injury.  She had evidence of syndesmosis disruption and widening.  She had instability and likely internal derangement of her ankle and therefore was indicated for ankle arthroscopy and open treatment of her syndesmosis.   Patient understood the risks, benefits and alternatives to surgery which include but are not limited to wound healing complications, infection, nonunion, malunion, need for further surgery as well as damage to surrounding structures. They also understood the potential for continued pain in that there were no guarantees of acceptable outcome After weighing these risks the patient opted to proceed with surgery.  PROCEDURE: Patient was identified in the preoperative holding area.  The right ankle was marked by myself.  Consent was signed by myself and the patient.  Block was performed by anesthesia in the preoperative holding area.  Patient was taken to the operative suite and placed supine on the operative table.  General LMA anesthesia was induced without difficulty. Bump was placed under the operative hip and bone foam was used.  All bony prominences were well padded.  Tourniquet was placed on the operative thigh.  Preoperative antibiotics were given. The extremity was prepped and draped in the usual  sterile fashion and surgical timeout was performed.  The limb was elevated and the tourniquet was inflated to 250 mmHg.  We began by making an anteromedial portal to the ankle joint.  This was done with an 11 blade through the skin.  Then blunt dissection was used with a hemostat down to the capsule.  Then the capsule was violated and the joint entered.  Then the trocar with the camera was placed.  There was a large amount of synovitis within the ankle joint.   Then a lateral portal was placed in a similar fashion.  The probe was placed to remove the synovitic tissue and the joint surfaces were inspected.    Upon inspection of the joint there was found to be without evidence of cartilage wear or chondrosis.  There was a significant amount of hematoma within the joint and some consolidated fibrous type tissue.  There was torn ligamentous structures as well as some superficial cartilage delamination.  Then the probe was removed and the shaver was inserted.  Extensive debridement was done of the ankle joint including synovial tissue and evidence of chondral impingement on the anterior distal tibial plafond.  We debrided bone along the anterior aspect of the distal tibia laterally and ligamentous tissue, synovial tissue and cartilage tissues within the joint and syndesmosis area.  The joint surfaces were inspected for any evidence of loose cartilage.  There was some remaining chondrosis notably within the lateral gutter and of the lateral anterior tibial plafond.  There was no full-thickness osteochondral defect noted.  This completed the arthroscopic debridement portion of the case.   We then turned our attention to the syndesmosis.  An incision was made overlying the distal aspect of the fibula at the level of the syndesmosis.  Incision was carried sharply  through skin and subcutaneous tissue.  We are able to mobilize skin flaps anteriorly.  No branches of the superficial peroneal nerve were identified.  Then the  syndesmosis was identified and it was grossly unstable.  Using a curette and a rondure were the syndesmosis was cleared out.  Then the syndesmosis was reduced under direct visualization and held with a Weber clamp.  A one third tubular plate was placed on the lateral aspect of the fibula and stabilized with the screw distal and proximal.  Then the appropriate position for the tight rope was identified and drill tunnel was made across the fibula and tibia in a standard fashion.  The tight rope was placed and tensioned maximally.  This was done with the ankle in neutral dorsiflexion and inversion.  After placement of tight rope the clamp was removed and stability was checked.  It was stable to syndesmosis stress.  Then final fluoroscopic images were obtained.  The wound was irrigated with normal saline.  The wounds were then closed in a layered fashion using 3-0 Monocryl and 3-0 nylon suture.    The leg was cleaned and the wounds were covered with Xeroform and a soft dressing.   A nonweightbearing short leg splint was placed.  She were awakened from anesthesia and taken recovery in stable condition.  All counts were correct at the end the case.  There was no complications.   POST OPERATIVE INSTRUCTIONS: Keep splint dry and in place Nonweightbearing to right lower extremity Call the office with concerns Follow-up in 2 weeks for splint removal, suture removal and likely placement of a nonweightbearing cast. No need for DVT prophylaxis.   TOURNIQUET TIME: Less than 2 hours  BLOOD LOSS:  Minimal         DRAINS: none         SPECIMEN: none       COMPLICATIONS:  * No complications entered in OR log *         Disposition: PACU - hemodynamically stable.         Condition: stable
